# Patient Record
Sex: Female | Born: 1955 | Race: White | Hispanic: No | Marital: Married | Smoking: Never smoker
Health system: Southern US, Community
[De-identification: ages and names within clinical notes are randomized; demographics above are authoritative.]

## PROBLEM LIST (undated history)

## (undated) DIAGNOSIS — E039 Hypothyroidism, unspecified: Secondary | ICD-10-CM

## (undated) DIAGNOSIS — R232 Flushing: Secondary | ICD-10-CM

## (undated) DIAGNOSIS — I10 Essential (primary) hypertension: Secondary | ICD-10-CM

## (undated) HISTORY — PX: TUBAL LIGATION: SHX77

## (undated) HISTORY — PX: FOOT SURGERY: SHX648

## (undated) HISTORY — DX: Essential (primary) hypertension: I10

## (undated) HISTORY — DX: Hypothyroidism, unspecified: E03.9

## (undated) HISTORY — DX: Flushing: R23.2

---

## 1998-01-29 ENCOUNTER — Other Ambulatory Visit: Admission: RE | Admit: 1998-01-29 | Discharge: 1998-01-29 | Payer: Self-pay | Admitting: Obstetrics and Gynecology

## 1998-03-01 ENCOUNTER — Inpatient Hospital Stay (HOSPITAL_COMMUNITY): Admission: AD | Admit: 1998-03-01 | Discharge: 1998-03-03 | Payer: Self-pay | Admitting: *Deleted

## 1999-02-24 ENCOUNTER — Other Ambulatory Visit: Admission: RE | Admit: 1999-02-24 | Discharge: 1999-02-24 | Payer: Self-pay | Admitting: Obstetrics and Gynecology

## 2000-03-23 ENCOUNTER — Other Ambulatory Visit: Admission: RE | Admit: 2000-03-23 | Discharge: 2000-03-23 | Payer: Self-pay | Admitting: Obstetrics and Gynecology

## 2001-08-25 ENCOUNTER — Other Ambulatory Visit: Admission: RE | Admit: 2001-08-25 | Discharge: 2001-08-25 | Payer: Self-pay | Admitting: Obstetrics and Gynecology

## 2002-10-17 ENCOUNTER — Other Ambulatory Visit: Admission: RE | Admit: 2002-10-17 | Discharge: 2002-10-17 | Payer: Self-pay | Admitting: Obstetrics and Gynecology

## 2003-12-18 ENCOUNTER — Other Ambulatory Visit: Admission: RE | Admit: 2003-12-18 | Discharge: 2003-12-18 | Payer: Self-pay | Admitting: Obstetrics and Gynecology

## 2005-02-11 ENCOUNTER — Other Ambulatory Visit: Admission: RE | Admit: 2005-02-11 | Discharge: 2005-02-11 | Payer: Self-pay | Admitting: Obstetrics and Gynecology

## 2005-09-09 ENCOUNTER — Encounter: Payer: Self-pay | Admitting: Internal Medicine

## 2006-02-17 ENCOUNTER — Ambulatory Visit: Payer: Self-pay | Admitting: Internal Medicine

## 2006-02-24 ENCOUNTER — Ambulatory Visit: Payer: Self-pay | Admitting: Cardiology

## 2006-09-06 ENCOUNTER — Ambulatory Visit: Payer: Self-pay | Admitting: Internal Medicine

## 2006-09-06 DIAGNOSIS — R930 Abnormal findings on diagnostic imaging of skull and head, not elsewhere classified: Secondary | ICD-10-CM | POA: Insufficient documentation

## 2006-09-10 ENCOUNTER — Encounter (INDEPENDENT_AMBULATORY_CARE_PROVIDER_SITE_OTHER): Payer: Self-pay | Admitting: *Deleted

## 2006-10-11 ENCOUNTER — Telehealth (INDEPENDENT_AMBULATORY_CARE_PROVIDER_SITE_OTHER): Payer: Self-pay | Admitting: *Deleted

## 2006-10-11 ENCOUNTER — Ambulatory Visit: Payer: Self-pay | Admitting: Internal Medicine

## 2007-04-04 ENCOUNTER — Ambulatory Visit: Payer: Self-pay | Admitting: Internal Medicine

## 2008-02-28 ENCOUNTER — Encounter: Payer: Self-pay | Admitting: Internal Medicine

## 2008-02-29 ENCOUNTER — Ambulatory Visit: Payer: Self-pay | Admitting: Family Medicine

## 2008-02-29 DIAGNOSIS — I10 Essential (primary) hypertension: Secondary | ICD-10-CM

## 2008-03-02 ENCOUNTER — Telehealth (INDEPENDENT_AMBULATORY_CARE_PROVIDER_SITE_OTHER): Payer: Self-pay | Admitting: *Deleted

## 2008-03-02 LAB — CONVERTED CEMR LAB
BUN: 16 mg/dL (ref 6–23)
Basophils Absolute: 0 10*3/uL (ref 0.0–0.1)
Basophils Relative: 0.2 % (ref 0.0–3.0)
CO2: 31 meq/L (ref 19–32)
Calcium: 9.6 mg/dL (ref 8.4–10.5)
Chloride: 102 meq/L (ref 96–112)
Creatinine, Ser: 0.8 mg/dL (ref 0.4–1.2)
Eosinophils Absolute: 0.1 10*3/uL (ref 0.0–0.7)
Eosinophils Relative: 1.8 % (ref 0.0–5.0)
GFR calc Af Amer: 98 mL/min
GFR calc non Af Amer: 81 mL/min
Glucose, Bld: 88 mg/dL (ref 70–99)
HCT: 42.2 % (ref 36.0–46.0)
Hemoglobin: 14.5 g/dL (ref 12.0–15.0)
Lymphocytes Relative: 31.3 % (ref 12.0–46.0)
MCHC: 34.3 g/dL (ref 30.0–36.0)
MCV: 93.5 fL (ref 78.0–100.0)
Monocytes Absolute: 0.8 10*3/uL (ref 0.1–1.0)
Monocytes Relative: 13 % — ABNORMAL HIGH (ref 3.0–12.0)
Neutro Abs: 3.1 10*3/uL (ref 1.4–7.7)
Neutrophils Relative %: 53.7 % (ref 43.0–77.0)
Platelets: 172 10*3/uL (ref 150–400)
Potassium: 4.2 meq/L (ref 3.5–5.1)
RBC: 4.52 M/uL (ref 3.87–5.11)
RDW: 11.7 % (ref 11.5–14.6)
Sodium: 139 meq/L (ref 135–145)
TSH: 7.36 microintl units/mL — ABNORMAL HIGH (ref 0.35–5.50)
WBC: 5.8 10*3/uL (ref 4.5–10.5)

## 2008-03-21 ENCOUNTER — Ambulatory Visit: Payer: Self-pay | Admitting: Family Medicine

## 2008-03-21 DIAGNOSIS — E039 Hypothyroidism, unspecified: Secondary | ICD-10-CM

## 2008-03-22 ENCOUNTER — Encounter (INDEPENDENT_AMBULATORY_CARE_PROVIDER_SITE_OTHER): Payer: Self-pay | Admitting: *Deleted

## 2008-03-22 LAB — CONVERTED CEMR LAB
BUN: 21 mg/dL (ref 6–23)
CO2: 30 meq/L (ref 19–32)
Calcium: 9.7 mg/dL (ref 8.4–10.5)
Chloride: 102 meq/L (ref 96–112)
Creatinine, Ser: 0.8 mg/dL (ref 0.4–1.2)
GFR calc Af Amer: 98 mL/min
GFR calc non Af Amer: 81 mL/min
Glucose, Bld: 85 mg/dL (ref 70–99)
Potassium: 4 meq/L (ref 3.5–5.1)
Sodium: 139 meq/L (ref 135–145)

## 2008-04-04 ENCOUNTER — Ambulatory Visit: Payer: Self-pay | Admitting: Family Medicine

## 2008-04-25 ENCOUNTER — Telehealth: Payer: Self-pay | Admitting: Internal Medicine

## 2008-05-07 ENCOUNTER — Ambulatory Visit: Payer: Self-pay | Admitting: Family Medicine

## 2008-05-09 ENCOUNTER — Encounter (INDEPENDENT_AMBULATORY_CARE_PROVIDER_SITE_OTHER): Payer: Self-pay | Admitting: *Deleted

## 2008-05-09 LAB — CONVERTED CEMR LAB: TSH: 1.09 microintl units/mL (ref 0.35–5.50)

## 2008-09-04 ENCOUNTER — Ambulatory Visit: Payer: Self-pay | Admitting: Family Medicine

## 2009-04-04 ENCOUNTER — Encounter (INDEPENDENT_AMBULATORY_CARE_PROVIDER_SITE_OTHER): Payer: Self-pay | Admitting: *Deleted

## 2009-05-16 ENCOUNTER — Ambulatory Visit: Payer: Self-pay | Admitting: Internal Medicine

## 2009-05-16 DIAGNOSIS — N951 Menopausal and female climacteric states: Secondary | ICD-10-CM | POA: Insufficient documentation

## 2009-05-21 LAB — CONVERTED CEMR LAB
BUN: 21 mg/dL (ref 6–23)
Basophils Absolute: 0 10*3/uL (ref 0.0–0.1)
Basophils Relative: 0.4 % (ref 0.0–3.0)
CO2: 31 meq/L (ref 19–32)
Calcium: 10.1 mg/dL (ref 8.4–10.5)
Chloride: 103 meq/L (ref 96–112)
Creatinine, Ser: 0.8 mg/dL (ref 0.4–1.2)
Eosinophils Absolute: 0.1 10*3/uL (ref 0.0–0.7)
Eosinophils Relative: 2 % (ref 0.0–5.0)
GFR calc non Af Amer: 80.23 mL/min (ref >60)
Glucose, Bld: 83 mg/dL (ref 70–99)
HCT: 42.8 % (ref 36.0–46.0)
Hemoglobin: 14.7 g/dL (ref 12.0–15.0)
Lymphocytes Relative: 34.5 % (ref 12.0–46.0)
Lymphs Abs: 1.6 10*3/uL (ref 0.7–4.0)
MCHC: 34.3 g/dL (ref 30.0–36.0)
MCV: 94.3 fL (ref 78.0–100.0)
Monocytes Absolute: 0.5 10*3/uL (ref 0.1–1.0)
Monocytes Relative: 11.8 % (ref 3.0–12.0)
Neutro Abs: 2.4 10*3/uL (ref 1.4–7.7)
Neutrophils Relative %: 51.3 % (ref 43.0–77.0)
Platelets: 177 10*3/uL (ref 150.0–400.0)
Potassium: 4.7 meq/L (ref 3.5–5.1)
RBC: 4.53 M/uL (ref 3.87–5.11)
RDW: 12.7 % (ref 11.5–14.6)
Sodium: 141 meq/L (ref 135–145)
TSH: 1.59 microintl units/mL (ref 0.35–5.50)
WBC: 4.6 10*3/uL (ref 4.5–10.5)

## 2009-09-23 ENCOUNTER — Ambulatory Visit: Payer: Self-pay | Admitting: Family Medicine

## 2009-09-23 DIAGNOSIS — L259 Unspecified contact dermatitis, unspecified cause: Secondary | ICD-10-CM

## 2009-09-25 ENCOUNTER — Ambulatory Visit: Payer: Self-pay | Admitting: Family Medicine

## 2009-09-25 DIAGNOSIS — H938X9 Other specified disorders of ear, unspecified ear: Secondary | ICD-10-CM

## 2010-01-14 ENCOUNTER — Encounter: Admission: RE | Admit: 2010-01-14 | Discharge: 2010-01-14 | Payer: Self-pay | Admitting: Obstetrics and Gynecology

## 2010-03-12 ENCOUNTER — Ambulatory Visit
Admission: RE | Admit: 2010-03-12 | Discharge: 2010-03-12 | Payer: Self-pay | Source: Home / Self Care | Attending: Internal Medicine | Admitting: Internal Medicine

## 2010-03-12 DIAGNOSIS — M26629 Arthralgia of temporomandibular joint, unspecified side: Secondary | ICD-10-CM | POA: Insufficient documentation

## 2010-03-12 DIAGNOSIS — H9319 Tinnitus, unspecified ear: Secondary | ICD-10-CM | POA: Insufficient documentation

## 2010-03-18 NOTE — Assessment & Plan Note (Signed)
Summary: poison ivy no better/cbs   Vital Signs:  Patient profile:   55 year old female Weight:      149 pounds Pulse rate:   96 / minute BP sitting:   114 / 72  (left arm)  Vitals Entered By: Doristine Devoid CMA (September 25, 2009 9:02 AM) CC: poison ivy worst    History of Present Illness: 55 yo woman here today for worsening poison.  hands and fingers now itching, R forearm, both legs.  thinks she was re-exposed yesterday.    also complains of itching and drainage from R ear.  denies pain.  no recent swimming.  Current Medications (verified): 1)  Effexor Xr 37.5 Mg  Cp24 (Venlafaxine Hcl) .Marland Kitchen.. 1 By Mouth Once Daily 2)  Hydrochlorothiazide 12.5 Mg  Tabs (Hydrochlorothiazide) .... Take 1 Tab  By Mouth Every Morning - 3)  Synthroid 100 Mcg Tabs (Levothyroxine Sodium) .... Take One Tablet Daily 4)  Hydrocortisone 2.5 % Oint (Hydrocortisone) .... Apply To Affected Area 2-3x/day.  Disp 1 Large Tube  Allergies (verified): 1)  ! Codeine  Review of Systems      See HPI  Physical Exam  General:  alert, well-developed, and well-nourished.   Ears:  debris in R ear canal, no pain w/ manipulation of pinna.  + Dry skin.  TM WNL Skin:  vesicles on dorsum of L hand, linear grouping of vesicles on L forearm, R forearm.  linear grouping of vesicles behind R knee   Impression & Recommendations:  Problem # 1:  CONTACT DERMATITIS (ICD-692.9) Assessment Deteriorated now w/ additional areas, increased itching.  start prednisone after depo medrol injxn in office.  reviewed supportive care and red flags that should prompt return.  Pt expresses understanding and is in agreement w/ this plan. Her updated medication list for this problem includes:    Hydrocortisone 2.5 % Oint (Hydrocortisone) .Marland Kitchen... Apply to affected area 2-3x/day.  disp 1 large tube    Prednisone 20 Mg Tabs (Prednisone) .Marland Kitchen... 2 tabs daily x5 days  Problem # 2:  OTHER DISORDERS OF EAR (ICD-388.8) Assessment: New dry, flakey skin w/  waxy debris in ear.  start cortisporin ear drops for possible early external otitis vs dermatitis.  Complete Medication List: 1)  Effexor Xr 37.5 Mg Cp24 (Venlafaxine hcl) .Marland Kitchen.. 1 by mouth once daily 2)  Hydrochlorothiazide 12.5 Mg Tabs (Hydrochlorothiazide) .... Take 1 tab  by mouth every morning - 3)  Synthroid 100 Mcg Tabs (Levothyroxine sodium) .... Take one tablet daily 4)  Hydrocortisone 2.5 % Oint (Hydrocortisone) .... Apply to affected area 2-3x/day.  disp 1 large tube 5)  Prednisone 20 Mg Tabs (Prednisone) .... 2 tabs daily x5 days 6)  Neomycin-polymyxin-hc 3.5-10000-1 Susp (Neomycin-polymyxin-hc) .... 3 drops to affected ear three times a day for 5 days.  disp 1 large bottle  Patient Instructions: 1)  Start prednisone tomorrow 2)  Try and avoid contact w/ the plant oils 3)  Wash everything that you were wearing while outside 4)  Use the ear drops as needed 5)  Hang in there! Prescriptions: NEOMYCIN-POLYMYXIN-HC 3.5-10000-1 SUSP (NEOMYCIN-POLYMYXIN-HC) 3 drops to affected ear three times a day for 5 days.  disp 1 large bottle  #1 x 0   Entered and Authorized by:   Neena Rhymes MD   Signed by:   Neena Rhymes MD on 09/25/2009   Method used:   Electronically to        [facility] [phone]* (retail)  [address]       [place], [place]  [address]       Ph: [phone]       Fax: [phone]   RxID:   [id number] PREDNISONE 20 MG TABS (PREDNISONE) 2 tabs daily x5 days  #10 x 0   Entered and Authorized by:   Neena Rhymes MD   Signed by:   Neena Rhymes MD on 09/25/2009   Method used:   Electronically to        [facility] [phone]* (retail)       [address]       [place], [place]  [address]       Ph: [phone]       Fax: [phone]   RxID:   [id number]   Appended Document: poison ivy no better/cbs    Clinical Lists Changes  Orders: Added new Service order of Depo- Medrol 80mg  (J1040) - Signed Added new Service order of Admin of  Therapeutic Inj  intramuscular or subcutaneous (60630) - Signed         Medication Administration  Injection # 1:    Medication: Depo- Medrol 80mg     Diagnosis: CONTACT DERMATITIS (ICD-692.9)    Route: IM    Site: R deltoid    Exp Date: 06/17/2010    Lot #: [id number]    Mfr: Pharmacia    Given by: Doristine Devoid CMA (September 25, 2009 9:27 AM)  Orders Added: 1)  Depo- Medrol 80mg  [J1040] 2)  Admin of Therapeutic Inj  intramuscular or subcutaneous [93235]

## 2010-03-18 NOTE — Assessment & Plan Note (Signed)
Summary: POISON OAK/PAZ PT//KN   Vital Signs:  Patient profile:   55 year old female Weight:      148 pounds Pulse rate:   98 / minute BP sitting:   110 / 80  (left arm)  Vitals Entered By: Doristine Devoid CMA (September 23, 2009 10:43 AM) CC: poison oak x1 week    History of Present Illness: 55 yo woman here today for ? poison oak.  was exposed 5-6 days ago while doing yard work.  has an area on L hand, L forearm, neck, behind knees.  red, itchy.  has used OTC poison ivy wash and ETOH.  Current Medications (verified): 1)  Effexor Xr 37.5 Mg  Cp24 (Venlafaxine Hcl) .Marland Kitchen.. 1 By Mouth Once Daily 2)  Hydrochlorothiazide 12.5 Mg  Tabs (Hydrochlorothiazide) .... Take 1 Tab  By Mouth Every Morning - 3)  Synthroid 100 Mcg Tabs (Levothyroxine Sodium) .... Take One Tablet Daily 4)  Hydrocortisone 2.5 % Oint (Hydrocortisone) .... Apply To Affected Area 2-3x/day.  Disp 1 Large Tube  Allergies (verified): 1)  ! Codeine  Review of Systems      See HPI  Physical Exam  General:  alert, well-developed, and well-nourished.   Skin:  vesicle on dorsum of L hand, linear grouping of vesicles on L forearm.  linear grouping of vesicles behind R knee   Impression & Recommendations:  Problem # 1:  CONTACT DERMATITIS (ICD-692.9) Assessment New mild.  start prescription steroid cream.  reviewed supportive care and red flags that should prompt return.  Pt expresses understanding and is in agreement w/ this plan. Her updated medication list for this problem includes:    Hydrocortisone 2.5 % Oint (Hydrocortisone) .Marland Kitchen... Apply to affected area 2-3x/day.  disp 1 large tube  Complete Medication List: 1)  Effexor Xr 37.5 Mg Cp24 (Venlafaxine hcl) .Marland Kitchen.. 1 by mouth once daily 2)  Hydrochlorothiazide 12.5 Mg Tabs (Hydrochlorothiazide) .... Take 1 tab  by mouth every morning - 3)  Synthroid 100 Mcg Tabs (Levothyroxine sodium) .... Take one tablet daily 4)  Hydrocortisone 2.5 % Oint (Hydrocortisone) .... Apply to  affected area 2-3x/day.  disp 1 large tube  Patient Instructions: 1)  Use the hydrocortisone cream as needed for itching/inflammation 2)  Benadryl as needed for night itching and/or sleep 3)  Wash everything that came in contact w/ the plant oil to prevent spread 4)  Hang in there! Prescriptions: HYDROCORTISONE 2.5 % OINT (HYDROCORTISONE) apply to affected area 2-3x/day.  disp 1 large tube  #1 x 1   Entered and Authorized by:   Neena Rhymes MD   Signed by:   Neena Rhymes MD on 09/23/2009   Method used:   Electronically to        [facility] [phone]* (retail)       [address]       [place], [place]  [address]       Ph: [phone]       Fax: [phone]   RxID:   [id number]

## 2010-03-18 NOTE — Assessment & Plan Note (Signed)
Summary: FU/KDC   Vital Signs:  Patient profile:   55 year old female Height:      68 inches Weight:      151 pounds BMI:     23.04 Pulse rate:   76 / minute BP sitting:   122 / 80  Vitals Entered By: Shary Decamp (May 16, 2009 10:46 AM) CC: rov - not fasting   History of Present Illness: ROV   Current Medications (verified): 1)  Effexor Xr 37.5 Mg  Cp24 (Venlafaxine Hcl) .Marland Kitchen.. 1 By Mouth Once Daily 2)  Hydrochlorothiazide 12.5 Mg  Tabs (Hydrochlorothiazide) .... Take 1 Tab  By Mouth Every Morning - 3)  Synthroid 100 Mcg Tabs (Levothyroxine Sodium) .... Take One Tablet Daily  Allergies (verified): 1)  ! Codeine  Past History:  Past Medical History: HTN hypothyroid hot flashes-- on effexor   Past Surgical History: BTL  Family History: CAD-- prents colon ca--no breast ca-- M aunt   Social History: parents Mr. 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 Resp:  Denies cough.  Physical Exam  General:  alert, well-developed, and well-nourished.   Ears:  R ear normal and L ear normal.  slightly dry skin in the concha Lungs:  normal respiratory effort, no intercostal retractions, no accessory muscle use, and normal breath sounds.   Heart:  normal rate, regular rhythm, and no murmur.   Extremities:  no edema Psych:  Oriented X3, memory intact for recent and remote, normally interactive, good eye contact, not anxious appearing, and not depressed appearing.     Impression & Recommendations:  Problem # 1:  HYPOTHYROIDISM (ICD-244.9)  labs advised her to return to the office once a year, depending on the results we may ask her to come back for blow work only in 6 months Her updated medication list for this problem includes:    Synthroid  100 Mcg Tabs (Levothyroxine sodium) .Marland Kitchen... Take one tablet daily  Labs Reviewed: TSH: 1.09 (05/07/2008)     Orders: TLB-TSH (Thyroid Stimulating Hormone) (84443-TSH)  Problem # 2:  HYPERTENSION, BENIGN ESSENTIAL (ICD-401.1)  seems to be well-controlled, labs Her updated medication list for this problem includes:    Hydrochlorothiazide 12.5 Mg Tabs (Hydrochlorothiazide) .Marland Kitchen... Take 1 tab  by mouth every morning -  BP today: 122/80 Prior BP: 140/90 (09/04/2008)  Labs Reviewed: K+: 4.0 (03/21/2008) Creat: : 0.8 (03/21/2008)     Orders: Venipuncture ([address]) TLB-BMP (Basic Metabolic Panel-BMET) (80048-METABOL) TLB-CBC Platelet - w/Differential (85025-CBCD)  Problem # 3:  HOT FLASHES (ICD-627.2) of Effexor per gynecology  Problem # 4:  ROUTINE GENERAL MEDICAL EXAM@HEALTH  CARE FACL (ICD-V70.0) sees gynecology routinely states that she had colonoscopies before would like her cholesterol checked today, she is not fasting, we agreed that she will ask her gynecologist check a FLP  and they could send me the results  Complete Medication List: 1)  Effexor Xr 37.5 Mg Cp24 (Venlafaxine hcl) .Marland Kitchen.. 1 by mouth once daily 2)  Hydrochlorothiazide 12.5 Mg Tabs (Hydrochlorothiazide) .... Take 1 tab  by mouth every morning - 3)  Synthroid 100 Mcg Tabs (Levothyroxine sodium) .... Take one tablet daily  Patient Instructions: 1)  Check your blood pressure 2 or 3 times a week. If it is  more than 140/85 consistently,please let us know  2)  Please schedule a follow-up appointment in 1 year.

## 2010-03-18 NOTE — Letter (Signed)
Summary: Primary Care Appointment Letter  [facility] [place]  [address] [place], [place] [address]   Phone: [phone]  Fax: [phone]    04/04/2009 MRN: [id number]  [facility] [address] [place], [place]  [address]  Dear Ms. Durene Cal,   Your Primary Care Physician Royalton E. Paz MD has indicated that:    __X_____it is time to schedule an appointment.  Please call our office @ [phone] to schedule an office visit.      Thank you,    [facility] Primary [facility] Scheduler

## 2010-03-20 NOTE — Assessment & Plan Note (Signed)
Summary: ear problem/kn   Vital Signs:  Patient profile:   55 year old female Height:      68 inches Weight:      152 pounds BMI:     23.20 Temp:     98.1 degrees F oral Pulse rate:   78 / minute Pulse rhythm:   regular BP sitting:   122 / 78  (left arm) Cuff size:   regular  Vitals Entered By: Army Fossa CMA (March 12, 2010 3:53 PM) CC: Pt here 2 problems Comments sounds like static in ear jaw joint will not stay in place [facility] [address]    History of Present Illness: 6 months  history of right-sided ringing in the ear described as "static", symptoms are on an off, worse at night.  also of several months history of problems with the right TMJ, it feels like the joint gets out of place when she bites. Also symptoms increase  at night when she sleeps on the right side.  ROS Some headaches No ear pain no taking any new medicines no history of jaw trauma She reports that her bite  may not be even.   Current Medications (verified): 1)  Effexor Xr 37.5 Mg  Cp24 (Venlafaxine Hcl) .Marland Kitchen.. 1 By Mouth Once Daily 2)  Hydrochlorothiazide 12.5 Mg  Tabs (Hydrochlorothiazide) .... Take 1 Tab  By Mouth Every Morning - 3)  Synthroid 100 Mcg Tabs (Levothyroxine Sodium) .... Take One Tablet Daily  Allergies (verified): 1)  ! Codeine  Past History:  Past Medical History: Reviewed history from 05/16/2009 and no changes required. HTN hypothyroid hot flashes-- on effexor   Past Surgical History: Reviewed history from 05/16/2009 and no changes required. BTL  Social History: Reviewed history from 05/16/2009 and no changes required. parents Mr. 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   Head:  face symmetric. Right TMJ with a click  and in fact it feels like her jaw is out of place with opening of the mouth Ears:  R ear normal and L ear normal.   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 Refer to ENT  Orders: ENT Referral (ENT)  Problem # 2:  TMJ PAIN (ICD-524.62)  several months history of pain and what seems to be TMJ dislocation She reports that her bite  may not be even. recommend ENT eval  she also may need to be seen by  her dentist as well.  Orders: ENT Referral (ENT)  Complete Medication List: 1)  Effexor Xr 37.5 Mg Cp24 (Venlafaxine hcl) .Marland Kitchen.. 1 by mouth once daily 2)  Hydrochlorothiazide 12.5 Mg Tabs (Hydrochlorothiazide) .... Take 1 tab  by mouth every morning - 3)  Synthroid 100 Mcg Tabs (Levothyroxine sodium) .... Take one tablet daily Prescriptions: SYNTHROID 100 MCG TABS (LEVOTHYROXINE SODIUM) take one tablet daily  #90 x 1   Entered and Authorized by:   Elita Quick E. Morgon Pamer MD   Signed by:   Nolon Rod. Dorrie Cocuzza MD on 03/12/2010   Method used:   Electronically to        [facility] [phone]* (retail)       [address]  [place], [place]  [address]       Ph: [phone]       Fax: [phone]   RxID:   [id number] HYDROCHLOROTHIAZIDE 12.5 MG  TABS (HYDROCHLOROTHIAZIDE) Take 1 tab  by mouth every morning -  #90 x 1   Entered and Authorized by:   Nolon Rod. Ceferino Lang MD   Signed by:   Nolon Rod. Elmyra Banwart MD on 03/12/2010   Method used:   Electronically to        [facility] [phone]* (retail)       [address]       [place], [place]  [address]       Ph: [phone]       Fax: [phone]   RxID:   [id number]    Orders Added: 1)  ENT Referral [ENT] 2)  Est. Patient Level III [[address]]

## 2010-03-31 ENCOUNTER — Encounter: Payer: Self-pay | Admitting: Internal Medicine

## 2010-04-08 ENCOUNTER — Encounter (INDEPENDENT_AMBULATORY_CARE_PROVIDER_SITE_OTHER): Payer: 59 | Admitting: Internal Medicine

## 2010-04-08 ENCOUNTER — Encounter: Payer: Self-pay | Admitting: Internal Medicine

## 2010-04-08 DIAGNOSIS — Z0000 Encounter for general adult medical examination without abnormal findings: Secondary | ICD-10-CM

## 2010-04-14 ENCOUNTER — Encounter: Payer: Self-pay | Admitting: Internal Medicine

## 2010-04-14 ENCOUNTER — Encounter (INDEPENDENT_AMBULATORY_CARE_PROVIDER_SITE_OTHER): Payer: Self-pay | Admitting: *Deleted

## 2010-04-14 ENCOUNTER — Other Ambulatory Visit (INDEPENDENT_AMBULATORY_CARE_PROVIDER_SITE_OTHER): Payer: 59

## 2010-04-14 ENCOUNTER — Other Ambulatory Visit: Payer: Self-pay | Admitting: Internal Medicine

## 2010-04-14 DIAGNOSIS — Z0000 Encounter for general adult medical examination without abnormal findings: Secondary | ICD-10-CM

## 2010-04-14 DIAGNOSIS — E785 Hyperlipidemia, unspecified: Secondary | ICD-10-CM

## 2010-04-14 LAB — CBC WITH DIFFERENTIAL/PLATELET
Basophils Relative: 0.6 % (ref 0.0–3.0)
Eosinophils Relative: 1.4 % (ref 0.0–5.0)
HCT: 41 % (ref 36.0–46.0)
Hemoglobin: 14.1 g/dL (ref 12.0–15.0)
Lymphs Abs: 2 10*3/uL (ref 0.7–4.0)
MCV: 93.5 fl (ref 78.0–100.0)
Monocytes Absolute: 0.7 10*3/uL (ref 0.1–1.0)
Monocytes Relative: 12.8 % — ABNORMAL HIGH (ref 3.0–12.0)
Neutro Abs: 2.3 10*3/uL (ref 1.4–7.7)
Platelets: 176 10*3/uL (ref 150.0–400.0)
RBC: 4.38 Mil/uL (ref 3.87–5.11)
WBC: 5.1 10*3/uL (ref 4.5–10.5)

## 2010-04-14 LAB — BASIC METABOLIC PANEL
BUN: 14 mg/dL (ref 6–23)
Chloride: 106 mEq/L (ref 96–112)
GFR: 86.13 mL/min (ref >60.00)
Potassium: 4.3 mEq/L (ref 3.5–5.1)
Sodium: 143 mEq/L (ref 135–145)

## 2010-04-14 LAB — LIPID PANEL
Cholesterol: 217 mg/dL — ABNORMAL HIGH (ref 0–200)
HDL: 70.8 mg/dL (ref >39.00)
Total CHOL/HDL Ratio: 3
Triglycerides: 60 mg/dL (ref 0.0–149.0)
VLDL: 12 mg/dL (ref 0.0–40.0)

## 2010-04-14 LAB — LDL CHOLESTEROL, DIRECT: Direct LDL: 132.9 mg/dL

## 2010-04-14 LAB — TSH: TSH: 2.1 u[IU]/mL (ref 0.35–5.50)

## 2010-04-14 LAB — CONVERTED CEMR LAB: Vit D, 25-Hydroxy: 20 ng/mL — ABNORMAL LOW (ref 30–89)

## 2010-04-15 NOTE — Assessment & Plan Note (Signed)
Summary: physical, will come back for fasting labs///sph   Vital Signs:  Patient profile:   55 year old female Weight:      153 pounds Pulse rate:   70 / minute Pulse rhythm:   regular BP sitting:   126 / 76  (left arm) Cuff size:   regular  Vitals Entered By: Army Fossa CMA (April 08, 2010 3:23 PM) CC: CPX, not fasting  Comments no complaints  [facility] [address]  due for pap and mammo- has a gyn  unsure of strenght of effexor   History of Present Illness: CPX   Preventive Screening-Counseling & Management  Alcohol-Tobacco     Alcohol drinks/day: 1     Alcohol type: wine  Caffeine-Diet-Exercise     Does Patient Exercise: yes  Current Medications (verified): 1)  Effexor Xr 37.5 Mg  Cp24 (Venlafaxine Hcl) .Marland Kitchen.. 1 By Mouth Once Daily 2)  Hydrochlorothiazide 12.5 Mg  Tabs (Hydrochlorothiazide) .... Take 1 Tab  By Mouth Every Morning - 3)  Synthroid 100 Mcg Tabs (Levothyroxine Sodium) .... Take One Tablet Daily  Allergies (verified): 1)  ! Codeine  Past History:  Past Medical History: Reviewed history from 05/16/2009 and no changes required. HTN hypothyroid hot flashes-- on effexor   Past Surgical History: Reviewed history from 05/16/2009 and no changes required. BTL  Family History: CAD-- parents;  F dx age late 56s  DM--no strokes --  colon ca--no breast ca-- MGM, aunt   Social History: parents Mr. 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 CV:  Denies chest pain or discomfort and swelling of feet. Resp:  Denies cough and shortness of breath. GI:  Denies bloody stools, diarrhea, nausea, and vomiting. Psych:  Denies anxiety and depression.  Physical Exam  General:  alert, well-developed, and well-nourished.   Neck:  no masses, no  thyromegaly, and normal carotid upstroke.   Lungs:  normal respiratory effort, no intercostal retractions, no accessory muscle use, and normal breath sounds.   Heart:  normal rate, regular rhythm, and no murmur.   Abdomen:  soft, non-tender, no distention, no masses, no guarding, and no rigidity.   Extremities:  no pretibial edema bilaterally  Psych:  Cognition and judgment appear intact. Alert and cooperative with normal attention span and concentration.  not anxious appearing and not depressed appearing.     Impression & Recommendations:  Problem # 1:  ROUTINE GENERAL MEDICAL EXAM@HEALTH  CARE FACL (ICD-V70.0)  Td 06 had flu shot  sees gynecology routinely [facility])  states that she had cscopes x 3, h/o GI bleed, last Cscope  ~2010, will need a new GI at some point (referal next year)  +FH of CAD, EKG today...NSR, poor Rwave progression? she is asx, repeat EKGs yearly   LDL goal <130  diet-exercise  discussed   Orders: EKG w/ Interpretation ([address])  Problem # 2:  other issues --has a cyst in the 2 R toe, saw podiatrist who aspirate somefluid, likes to see somebody else. She willl call Dr Jerl Santos for an appointment, he sees her mom --ENT dental eval pending (see previous notes )  Complete Medication List: 1)  Effexor Xr 37.5 Mg Cp24 (Venlafaxine hcl) .Marland Kitchen.. 1 by mouth once daily 2)  Hydrochlorothiazide 12.5 Mg Tabs (Hydrochlorothiazide) .... Take 1 tab  by mouth every morning - 3)  Synthroid 100 Mcg Tabs (Levothyroxine sodium) .... Take one tablet daily  Patient Instructions: 1)  came back fasting: 2)  FLP BMP CBC TSH AST ALT Vit D---dx v70 3)  Please schedule a follow-up appointment in 1 year.    Orders Added: 1)  EKG w/ Interpretation [93[address]] 2)  Est. Patient age 32-64 [[address]]   Immunization History:  Tetanus/Td Immunization History:    Tetanus/Td:  historical (02/17/2004)  Influenza Immunization History:    Influenza:  historical  (10/17/2009)   Immunization History:  Tetanus/Td Immunization History:    Tetanus/Td:  Historical (02/17/2004)  Influenza Immunization History:    Influenza:  Historical (10/17/2009)    Preventive Care Screening  Colonoscopy:    Date:  02/17/2008    Results:  normal-per pt   Last Tetanus Booster:    Date:  02/17/2004    Results:  Historical     Risk Factors:  Alcohol use:  yes    Type:  wine    Drinks per day:  1 Exercise:  yes  Colonoscopy History:     Date of Last Colonoscopy:  02/17/2008    Results:  normal-per pt

## 2010-06-24 ENCOUNTER — Ambulatory Visit (INDEPENDENT_AMBULATORY_CARE_PROVIDER_SITE_OTHER): Payer: 59 | Admitting: Internal Medicine

## 2010-06-24 ENCOUNTER — Encounter: Payer: Self-pay | Admitting: Internal Medicine

## 2010-06-24 DIAGNOSIS — J069 Acute upper respiratory infection, unspecified: Secondary | ICD-10-CM

## 2010-06-24 MED ORDER — HYDROCODONE-HOMATROPINE 5-1.5 MG/5ML PO SYRP: 5.0000 mL | ORAL_SOLUTION | Freq: Four times a day (QID) | ORAL | Status: AC | PRN

## 2010-06-24 MED ORDER — AZITHROMYCIN 250 MG PO TABS: ORAL_TABLET | ORAL | Status: AC

## 2010-06-24 NOTE — Patient Instructions (Signed)
Rest, fluids , tylenol For cough, take Mucinex DM twice a day as needed  If the cough is severe , use hydrocodone (will cause drowsiness ) OTC claritin 10mg  1 a day Saline nasal spray as needed Take the antibiotic as prescribed (zothromax) if no better in 3-4 days Call if no better in few days Call anytime if the symptoms are severe, you have high fever, short of breath

## 2010-06-24 NOTE — Progress Notes (Signed)
  Subjective:    Patient ID: Zaylie Gisler, female    DOB: 07/23/1955, 55 y.o.   MRN: [id number]  HPI Symptoms started about 6 days ago with sore throat some postnasal dripping. 4 days ago she developed an intense cough at night, can't sleep well, due to the cough she vomited once. Past Medical History  Diagnosis Date  . Hypertension   . Hypothyroid   . Hot flashes     on effexor   Past Surgical History  Procedure Date  . Btl       Review of Systems Denies fever No chest congestion, no sputum production. Mild sinus congestion. No diarrhea. She has a severe runny nose but denies itchy eyes or itchy nose    Objective:   Physical Exam Alert, oriented x3. Face is symmetric. Not tender to palpation. ears: Tympanic membranes normal. Nose is congested. Oropharynx, no redness or discharge. Lungs, clear to auscultation bilaterally. Cardiovascular regular rate and rhythm without a murmur.       Assessment & Plan:

## 2010-06-24 NOTE — Assessment & Plan Note (Signed)
Is hard to distinguish between allergies or infection. She also has acquired chickens about a month ago and she may be allergic to them. Will treat as an allergy for few days but if she is no better, she will start taking an antibiotic. See instructions.

## 2010-07-16 ENCOUNTER — Other Ambulatory Visit: Payer: Self-pay | Admitting: Internal Medicine

## 2010-09-05 ENCOUNTER — Other Ambulatory Visit: Payer: Self-pay | Admitting: Internal Medicine

## 2010-10-16 ENCOUNTER — Other Ambulatory Visit: Payer: Self-pay | Admitting: Internal Medicine

## 2011-01-01 ENCOUNTER — Encounter: Payer: Self-pay | Admitting: Internal Medicine

## 2011-01-01 ENCOUNTER — Ambulatory Visit (INDEPENDENT_AMBULATORY_CARE_PROVIDER_SITE_OTHER): Payer: 59 | Admitting: Internal Medicine

## 2011-01-01 VITALS — BP 152/100 | HR 73 | Temp 98.2°F | Ht 67.0 in | Wt 161.0 lb

## 2011-01-01 DIAGNOSIS — I10 Essential (primary) hypertension: Secondary | ICD-10-CM

## 2011-01-01 DIAGNOSIS — J069 Acute upper respiratory infection, unspecified: Secondary | ICD-10-CM

## 2011-01-01 MED ORDER — AMOXICILLIN 500 MG PO CAPS: 1000.0000 mg | ORAL_CAPSULE | Freq: Two times a day (BID) | ORAL | Status: AC

## 2011-01-01 NOTE — Patient Instructions (Signed)
Rest, fluids , tylenol For cough, take Mucinex DM twice a day as needed  No decongestants Take the antibiotic as prescribed, Amoxicillin, if no better in few days  Call if no better in few days Call anytime if the symptoms are severe Check the  blood pressure 3 or 4 times a week, be sure it is less than 140/85. If it is consistently higher, let me know

## 2011-01-01 NOTE — Assessment & Plan Note (Signed)
BP elevated few days ago at another doctor's office and initially during this visit. I rechecked it myself and it was 140/75. I recommend to continue with her healthy lifestyle, try to eat less salt and keep an eye on her blood pressure. If the blood pressure is elevated I like to make changes before her next appointment in February 2013.

## 2011-01-01 NOTE — Progress Notes (Signed)
  Subjective:    Patient ID: Erin Long, female    DOB: [birth year], 55 y.o.   MRN: [id number]  HPI 4-5 days history of sore throat, mild cough. Ready to leave town, would like to get something before she leaves. Also her BP was noted to be slightly elevated at another doctor's office few days ago,  BP today was 150/100.  Past Medical History  Diagnosis Date  . Hypertension   . Hypothyroid   . Hot flashes     on effexor   Past Surgical History  Procedure Date  . Btl       Review of Systems No fever or chills Mild sinus congestion, no sputum production. No chest congestion. As far as diet -exercise, she is actually doing better in the last few months. Admits that she is not doing a low-salt diet    Objective:   Physical Exam  Constitutional: She appears well-developed and well-nourished. No distress.  HENT:  Head: Normocephalic and atraumatic.  Right Ear: External ear normal.  Left Ear: External ear normal.  Mouth/Throat: No oropharyngeal exudate.       Face symmetric, not tender to palpation  Cardiovascular: Normal rate, regular rhythm and normal heart sounds.   No murmur heard.      BP rechecked by me 140/75  Pulmonary/Chest: Effort normal and breath sounds normal. No respiratory distress. She has no wheezes. She has no rales.  Skin: She is not diaphoretic.          Assessment & Plan:  URI: See instructions

## 2011-01-20 ENCOUNTER — Other Ambulatory Visit: Payer: Self-pay | Admitting: Internal Medicine

## 2011-01-21 ENCOUNTER — Other Ambulatory Visit: Payer: Self-pay | Admitting: Internal Medicine

## 2011-04-18 ENCOUNTER — Other Ambulatory Visit: Payer: Self-pay | Admitting: Internal Medicine

## 2011-04-20 NOTE — Telephone Encounter (Signed)
Prescription sent to the pharmacy.

## 2011-07-27 ENCOUNTER — Other Ambulatory Visit: Payer: Self-pay | Admitting: Internal Medicine

## 2011-07-27 NOTE — Telephone Encounter (Signed)
Refill done.  

## 2011-12-03 ENCOUNTER — Other Ambulatory Visit: Payer: Self-pay | Admitting: Internal Medicine

## 2011-12-03 NOTE — Telephone Encounter (Signed)
Refill request sent in error.

## 2011-12-29 ENCOUNTER — Ambulatory Visit: Payer: 59 | Admitting: Internal Medicine

## 2011-12-30 ENCOUNTER — Ambulatory Visit: Payer: 59 | Admitting: Internal Medicine

## 2012-01-06 ENCOUNTER — Other Ambulatory Visit: Payer: Self-pay | Admitting: Internal Medicine

## 2012-01-06 NOTE — Telephone Encounter (Signed)
Refill done. Pt must schedule OV for future refills.  

## 2012-01-25 ENCOUNTER — Telehealth: Payer: Self-pay | Admitting: Internal Medicine

## 2012-02-01 ENCOUNTER — Ambulatory Visit (INDEPENDENT_AMBULATORY_CARE_PROVIDER_SITE_OTHER): Payer: 59 | Admitting: Internal Medicine

## 2012-02-01 ENCOUNTER — Encounter: Payer: Self-pay | Admitting: Internal Medicine

## 2012-02-01 VITALS — BP 138/84 | HR 87 | Temp 98.0°F | Wt 160.0 lb

## 2012-02-01 DIAGNOSIS — K219 Gastro-esophageal reflux disease without esophagitis: Secondary | ICD-10-CM | POA: Insufficient documentation

## 2012-02-01 DIAGNOSIS — Z23 Encounter for immunization: Secondary | ICD-10-CM

## 2012-02-01 DIAGNOSIS — E039 Hypothyroidism, unspecified: Secondary | ICD-10-CM

## 2012-02-01 DIAGNOSIS — E559 Vitamin D deficiency, unspecified: Secondary | ICD-10-CM

## 2012-02-01 DIAGNOSIS — I10 Essential (primary) hypertension: Secondary | ICD-10-CM

## 2012-02-01 DIAGNOSIS — Z0000 Encounter for general adult medical examination without abnormal findings: Secondary | ICD-10-CM | POA: Insufficient documentation

## 2012-02-01 LAB — TSH: TSH: 1.39 u[IU]/mL (ref 0.35–5.50)

## 2012-02-01 LAB — BASIC METABOLIC PANEL
BUN: 16 mg/dL (ref 6–23)
Chloride: 101 mEq/L (ref 96–112)
Glucose, Bld: 97 mg/dL (ref 70–99)
Potassium: 3.9 mEq/L (ref 3.5–5.1)

## 2012-02-01 LAB — CBC WITH DIFFERENTIAL/PLATELET
Basophils Absolute: 0 10*3/uL (ref 0.0–0.1)
Eosinophils Absolute: 0.1 10*3/uL (ref 0.0–0.7)
HCT: 42.5 % (ref 36.0–46.0)
Lymphs Abs: 1.9 10*3/uL (ref 0.7–4.0)
MCV: 91.5 fl (ref 78.0–100.0)
Monocytes Absolute: 0.7 10*3/uL (ref 0.1–1.0)
Platelets: 182 10*3/uL (ref 150.0–400.0)
RDW: 12.5 % (ref 11.5–14.6)

## 2012-02-01 MED ORDER — HYDROCHLOROTHIAZIDE 12.5 MG PO CAPS: 12.5000 mg | ORAL_CAPSULE | Freq: Every day | ORAL | Status: DC

## 2012-02-01 MED ORDER — OMEPRAZOLE 40 MG PO CPDR: 40.0000 mg | DELAYED_RELEASE_CAPSULE | Freq: Every day | ORAL | Status: DC

## 2012-02-01 MED ORDER — LEVOTHYROXINE SODIUM 100 MCG PO TABS: 100.0000 ug | ORAL_TABLET | Freq: Every day | ORAL | Status: DC

## 2012-02-01 MED ORDER — VENLAFAXINE HCL ER 37.5 MG PO CP24: 37.5000 mg | ORAL_CAPSULE | Freq: Every day | ORAL | Status: DC

## 2012-02-01 NOTE — Assessment & Plan Note (Signed)
Symptoms consistent with GERD without red flag symptoms. Plan: Precautions discussed, see instructions. Omeprazole 40 mg before breakfast. Reassess on return to the office

## 2012-02-01 NOTE — Assessment & Plan Note (Signed)
Recommend  Daily vit d, recheck on return to the office

## 2012-02-01 NOTE — Assessment & Plan Note (Signed)
Good compliance, check a TSH

## 2012-02-01 NOTE — Progress Notes (Signed)
  Subjective:    Patient ID: Erin Long, female    DOB: [birth year], 56 y.o.   MRN: [id number]  HPI Office Hypertension, good medication compliance, no ambulatory BP Hypothyroidism, good medication compliance, due for labs. History of hot flashes, well-controlled on a low dose of Effexor GERD, has occasional on and off GERD symptoms described as classic heartburn, omeprazole OTC help to some extent but would like a prescribe medication.  Past Medical History  Diagnosis Date  . Hypertension   . Hypothyroid   . Hot flashes     on effexor   Past Surgical History  Procedure Date  . Btl    History   Social History  . Marital Status: Married    Spouse Name: N/A    Number of Children: 1  . Years of Education: N/A   Occupational History  . stay at home mom    Social History Main Topics  . Smoking status: Never Smoker   . Smokeless tobacco: Never Used  . Alcohol Use: Yes     Comment: socially  . Drug Use: No  . Sexually Active: Not on file   Other Topics Concern  . Not on file   Social History Narrative   One child one amd 3 Gchildren----Parents Mr. And Mrs.Buchanana(they are my pts)Very active, stationary bike sometimes      Review of Systems Denies fever chills or weight loss. No abdominal pain, blood in the stools or change in the color of the stools. No taking  any Motrin or Motrin-like medications. History of vitamin D deficiency, not taking vitamin D    Objective:   Physical Exam General -- alert, well-developed, and well-nourished.   Neck --no thyromegaly Lungs -- normal respiratory effort, no intercostal retractions, no accessory muscle use, and normal breath sounds.   Heart-- normal rate, regular rhythm, no murmur, and no gallop.   Abdomen--soft, non-tender, no distention, no masses, no HSM, no guarding, and no rigidity.   Extremities-- no pretibial edema bilaterally Psych-- Cognition and judgment appear intact. Alert and cooperative with normal  attention span and concentration.  not anxious appearing and not depressed appearing.      Assessment & Plan:

## 2012-02-01 NOTE — Assessment & Plan Note (Signed)
sees gynecology, recommend a CPX here at her convenience for non-female issues.

## 2012-02-01 NOTE — Patient Instructions (Addendum)
Take over-the-counter vitamin D between 600 and 1200 units daily. Please schedule a complete physical in around 4 months, fasting   Diet for Gastroesophageal Reflux Disease, Adult Reflux (acid reflux) is when acid from your stomach flows up into the esophagus. When acid comes in contact with the esophagus, the acid causes irritation and soreness (inflammation) in the esophagus. When reflux happens often or so severely that it causes damage to the esophagus, it is called gastroesophageal reflux disease (GERD). Nutrition therapy can help ease the discomfort of GERD. FOODS OR DRINKS TO AVOID OR LIMIT  Smoking or chewing tobacco. Nicotine is one of the most potent stimulants to acid production in the gastrointestinal tract.  Caffeinated and decaffeinated coffee and black tea.  Regular or low-calorie carbonated beverages or energy drinks (caffeine-free carbonated beverages are allowed).   Strong spices, such as black pepper, white pepper, red pepper, cayenne, curry powder, and chili powder.  Peppermint or spearmint.  Chocolate.  High-fat foods, including meats and fried foods. Extra added fats including oils, butter, salad dressings, and nuts. Limit these to less than 8 tsp per day.  Fruits and vegetables if they are not tolerated, such as citrus fruits or tomatoes.  Alcohol.  Any food that seems to aggravate your condition. If you have questions regarding your diet, call your caregiver or a registered dietitian. OTHER THINGS THAT MAY HELP GERD INCLUDE:   Eating your meals slowly, in a relaxed setting.  Eating 5 to 6 small meals per day instead of 3 large meals.  Eliminating food for a period of time if it causes distress.  Not lying down until 3 hours after eating a meal.  Keeping the head of your bed raised 6 to 9 inches (15 to 23 cm) by using a foam wedge or blocks under the legs of the bed. Lying flat may make symptoms worse.  Being physically active. Weight loss may be helpful  in reducing reflux in overweight or obese adults.  Wear loose fitting clothing EXAMPLE MEAL PLAN This meal plan is approximately 2,000 calories based on https://www.bernard.org/ meal planning guidelines. Breakfast   cup cooked oatmeal.  1 cup strawberries.  1 cup low-fat milk.  1 oz almonds. Snack  1 cup cucumber slices.  6 oz yogurt (made from low-fat or fat-free milk). Lunch  2 slice whole-wheat bread.  2 oz sliced [place].  2 tsp mayonnaise.  1 cup blueberries.  1 cup snap peas. Snack  6 whole-wheat crackers.  1 oz string cheese. Dinner   cup brown rice.  1 cup mixed veggies.  1 tsp olive oil.  3 oz grilled fish. Document Released: 02/02/2005 Document Revised: 04/27/2011 Document Reviewed: 12/19/2010 [facility] Patient Information 2013 [place], [place].

## 2012-02-01 NOTE — Assessment & Plan Note (Signed)
Seems well controlled, continue with the HCTZ, check a BMP and a CBC

## 2012-02-04 ENCOUNTER — Encounter: Payer: Self-pay | Admitting: *Deleted

## 2012-02-18 ENCOUNTER — Other Ambulatory Visit: Payer: Self-pay | Admitting: Internal Medicine

## 2012-02-18 NOTE — Telephone Encounter (Signed)
Spoke to rachel at pharmacy & she confirmed pt still has refills left on file. Refill request sent in error.

## 2012-09-21 ENCOUNTER — Other Ambulatory Visit: Payer: Self-pay | Admitting: Internal Medicine

## 2012-09-21 NOTE — Telephone Encounter (Signed)
Refill done per protocol.  

## 2012-11-25 ENCOUNTER — Ambulatory Visit (INDEPENDENT_AMBULATORY_CARE_PROVIDER_SITE_OTHER): Payer: 59

## 2012-11-25 DIAGNOSIS — Z23 Encounter for immunization: Secondary | ICD-10-CM

## 2013-02-27 ENCOUNTER — Other Ambulatory Visit: Payer: Self-pay | Admitting: Internal Medicine

## 2013-03-02 LAB — HM MAMMOGRAPHY

## 2013-03-08 ENCOUNTER — Other Ambulatory Visit: Payer: Self-pay | Admitting: Obstetrics and Gynecology

## 2013-03-08 DIAGNOSIS — Z803 Family history of malignant neoplasm of breast: Secondary | ICD-10-CM

## 2013-03-13 ENCOUNTER — Ambulatory Visit: Payer: 59 | Admitting: Cardiology

## 2013-03-23 ENCOUNTER — Encounter (INDEPENDENT_AMBULATORY_CARE_PROVIDER_SITE_OTHER): Payer: Self-pay

## 2013-03-23 ENCOUNTER — Encounter: Payer: Self-pay | Admitting: Cardiology

## 2013-03-23 ENCOUNTER — Ambulatory Visit (INDEPENDENT_AMBULATORY_CARE_PROVIDER_SITE_OTHER): Payer: 59 | Admitting: Cardiology

## 2013-03-23 VITALS — BP 130/80 | HR 68 | Ht 67.0 in | Wt 157.0 lb

## 2013-03-23 DIAGNOSIS — I10 Essential (primary) hypertension: Secondary | ICD-10-CM

## 2013-03-23 DIAGNOSIS — R079 Chest pain, unspecified: Secondary | ICD-10-CM | POA: Insufficient documentation

## 2013-03-23 NOTE — Progress Notes (Signed)
     HPI: 58 year old female for evaluation of chest pain. No prior cardiac history. She exercises routinely with no chest pain. She denies dyspnea on exertion, orthopnea, PND, pedal edema, palpitations, syncope. She has risk factors including strong family history. Approximately 2 weeks ago while watching TV she had chest tightness for approximately 10-15 minutes. The pain was not pleuritic or positional. No radiation. Resolved spontaneously. No associated symptoms.  Current Outpatient Prescriptions  Medication Sig Dispense Refill  . hydrochlorothiazide (MICROZIDE) 12.5 MG capsule TAKE 1 CAPSULE BY MOUTH DAILY  30 capsule  0  . levothyroxine (SYNTHROID, LEVOTHROID) 100 MCG tablet TAKE 1 TABLET BY MOUTH DAILY  30 tablet  0  . omeprazole (PRILOSEC) 40 MG capsule TAKE 1 CAPSULE BY MOUTH DAILY  30 capsule  5  . venlafaxine XR (EFFEXOR-XR) 37.5 MG 24 hr capsule TAKE 1 CAPSULE BY MOUTH DAILY  30 capsule  0   No current facility-administered medications for this visit.    Allergies  Allergen Reactions  . Codeine     REACTION: rash    Past Medical History  Diagnosis Date  . Hypertension   . Hypothyroid   . Hot flashes     on effexor    Past Surgical History  Procedure Laterality Date  . Tubal ligation Bilateral   . Foot surgery      twice    History   Social History  . Marital Status: Married    Spouse Name: N/A    Number of Children: 1  . Years of Education: N/A   Occupational History  . stay at home mom    Social History Main Topics  . Smoking status: Never Smoker   . Smokeless tobacco: Never Used  . Alcohol Use: Yes     Comment: socially  . Drug Use: No  . Sexual Activity: Not on file   Other Topics Concern  . Not on file   Social History Narrative   One child one amd 3 Gchildren----   Parents Mr. And Mrs.Buchanana(they are my pts)   Very active, stationary bike sometimes     Family History  Problem Relation Age of Onset  . Coronary artery disease Father      mid 7440's  . Diabetes Neg Hx   . Stroke Neg Hx   . Colon cancer Neg Hx   . Breast cancer Maternal Grandmother   . Breast cancer      aunt  . Heart disease Mother 50    ROS: no fevers or chills, productive cough, hemoptysis, dysphasia, odynophagia, melena, hematochezia, dysuria, hematuria, rash, seizure activity, orthopnea, PND, pedal edema, claudication. Remaining systems are negative.  Physical Exam:   Blood pressure 130/80, pulse 68, height 5\' 7"  (1.702 m), weight 157 lb (71.215 kg).  General:  Well developed/well nourished in NAD Skin warm/dry Patient not depressed No peripheral clubbing Back-normal HEENT-normal/normal eyelids Neck supple/normal carotid upstroke bilaterally; no bruits; no JVD; no thyromegaly chest - CTA/ normal expansion CV - RRR/normal S1 and S2; no murmurs, rubs or gallops;  PMI nondisplaced Abdomen -NT/ND, no HSM, no mass, + bowel sounds, no bruit 2+ femoral pulses, no bruits Ext-no edema, chords, 2+ DP Neuro-grossly nonfocal  ECG sinus rhythm at a rate of 68. No ST changes.

## 2013-03-23 NOTE — Assessment & Plan Note (Signed)
Blood pressure controlled. Continue present medications. 

## 2013-03-23 NOTE — Patient Instructions (Signed)
Your physician recommends that you schedule a follow-up appointment in: AS NEEDED  Your physician has requested that you have an exercise tolerance test. For further information please visit [email or web address] Please also follow instruction sheet, as given.     Exercise Stress Electrocardiogram An exercise stress electrocardiogram is a test to check how blood flows to your heart. It is done to find areas of poor blood flow. You will need to walk on a treadmill for this test. The electrocardiogram will record your heartbeat when you are at rest and when you are exercising. BEFORE THE PROCEDURE  Do not have drinks with caffeine or foods with caffeine for 24 hours before the test, or as told by your doctor.  Follow your doctor's instructions about eating and drinking before the test.  Ask your doctor what medicines you should or should not take before the test. Take your medicines with water unless told by your doctor not to.  If you use an inhaler, bring it with you to the test.  Bring a snack to eat after the test.  Do not  smoke for 4 hours before the test.  Wear comfortable shoes and clothing. PROCEDURE  You will have patches put on your chest. Small areas of your chest may need to be shaved. Wires will be connected to the patches.  Your heart rate will be watched while you are resting and while you are exercising.  You will walk on the treadmill. The treadmill will slowly get faster to raise your heart rate.  The test will take about 1 2 hours. AFTER THE PROCEDURE  Your heart rate and blood pressure will be watched after the test.  You may return to your normal diet, activities, and medicines or as told by your doctor. Document Released: 07/22/2007 Document Revised: 11/23/2012 Document Reviewed: 10/10/2012 [facility] Patient Information 2014 [place].

## 2013-03-23 NOTE — Assessment & Plan Note (Signed)
Symptoms atypical. Strong family history. Schedule exercise treadmill.

## 2013-03-25 ENCOUNTER — Other Ambulatory Visit: Payer: Self-pay | Admitting: Internal Medicine

## 2013-03-28 ENCOUNTER — Inpatient Hospital Stay: Admission: RE | Admit: 2013-03-28 | Payer: 59 | Source: Ambulatory Visit

## 2013-04-10 ENCOUNTER — Telehealth: Payer: Self-pay

## 2013-04-10 NOTE — Telephone Encounter (Signed)
Medication and allergies:  Reviewed and updated  90 day supply/mail order: n/a [facility] pharmacy[facility]  [facility] [id number] - [place], [place] - [address]   Immunizations due: UTD   A/P: No changes to personal, family history or past surgical hx PAP- 03/02/13- negative per patient CCS- 02/17/08- negative MMG- 03/02/13- negative  Flu- 11/22/12 Tdap- 12/17/2004   To Discuss with Provider: Not at this time.

## 2013-04-11 ENCOUNTER — Encounter: Payer: 59 | Admitting: Internal Medicine

## 2013-04-18 ENCOUNTER — Other Ambulatory Visit: Payer: Self-pay | Admitting: *Deleted

## 2013-04-18 ENCOUNTER — Telehealth: Payer: Self-pay

## 2013-04-18 NOTE — Telephone Encounter (Addendum)
Medication and allergies: Reviewed and updated   90 day supply/mail order: n/a  Local pharmacy: [facility]  [address]   Immunizations due: UTD   A/P:  No changes to personal, family history or past surgical hx  PAP- 03/02/13- negative per patient  CCS- 02/17/08- negative  MMG- 03/02/13- negative  Flu- 11/22/12  Tdap- 12/17/2004   To Discuss with Provider:  Not at this time.

## 2013-04-19 ENCOUNTER — Encounter: Payer: Self-pay | Admitting: Internal Medicine

## 2013-04-19 ENCOUNTER — Ambulatory Visit (INDEPENDENT_AMBULATORY_CARE_PROVIDER_SITE_OTHER): Payer: 59 | Admitting: Internal Medicine

## 2013-04-19 ENCOUNTER — Telehealth: Payer: Self-pay | Admitting: Internal Medicine

## 2013-04-19 VITALS — BP 153/93 | HR 88 | Temp 97.9°F | Ht 66.5 in | Wt 158.0 lb

## 2013-04-19 DIAGNOSIS — Z0000 Encounter for general adult medical examination without abnormal findings: Secondary | ICD-10-CM

## 2013-04-19 DIAGNOSIS — E039 Hypothyroidism, unspecified: Secondary | ICD-10-CM

## 2013-04-19 DIAGNOSIS — I10 Essential (primary) hypertension: Secondary | ICD-10-CM

## 2013-04-19 NOTE — Progress Notes (Signed)
Pre visit review using our clinic review tool, if applicable. No additional management support is needed unless otherwise documented below in the visit note. 

## 2013-04-19 NOTE — Assessment & Plan Note (Addendum)
Td 2006 Had a flu shot  Had a bone density test at gynecology, told it was negative. Does not take calcium and vitamin D, encouraged her to do that. PAP- 03/02/13- negative per patient  + FH breast cancer, reports her gynecologist recommended a breast MRI, patient is not sure if she should proceed, I think that is very good recommendation given her family history. MMG- 03/02/13- negative  Cscope 2010, scope done d/t bleed, no report available for review, pt told it was (-), no polyps  Plan-- hemocults, get report if possible  Continue with her healthy lifestyle. Plan

## 2013-04-19 NOTE — Telephone Encounter (Signed)
Relevant patient education mailed to patient.  

## 2013-04-19 NOTE — Assessment & Plan Note (Signed)
Check a TSH 

## 2013-04-19 NOTE — Progress Notes (Signed)
Subjective:    Patient ID: Erin Long, female    DOB: 1955/02/20, 59 y.o.   MRN: [id number]  DOS:  04/19/2013 Type of  visit: CPX Feeling well   ROS Diet-- healthy Exercise-- gym-trainer x 3/week Had  CP saw cards, to have a stress test; no SOB. No palpitations, no lower extremity edema Denies  nausea, vomiting diarrhea No abdominal pain Denies  blood in the stools (-) cough, sputum production (-) wheezing, chest congestion No dysuria, gross hematuria, difficulty urinating  No anxiety, depression   Past Medical History  Diagnosis Date  . Hypertension   . Hypothyroid   . Hot flashes     on effexor    Past Surgical History  Procedure Laterality Date  . Tubal ligation Bilateral   . Foot surgery      twice    History   Social History  . Marital Status: Married    Spouse Name: N/A    Number of Children: 1  . Years of Education: N/A   Occupational History  . stay at home mom    Social History Main Topics  . Smoking status: Never Smoker   . Smokeless tobacco: Never Used  . Alcohol Use: Yes     Comment: socially  . Drug Use: No  . Sexual Activity: Not on file   Other Topics Concern  . Not on file   Social History Narrative   One child one amd 3 Gchildren   Parents Mr. And Mrs.Buchanana(they are my pts)         Family History  Problem Relation Age of Onset  . Coronary artery disease Father     mid 32's  . Diabetes Neg Hx   . Stroke Neg Hx   . Colon cancer Neg Hx   . Breast cancer Maternal Grandmother   . Breast cancer      aunt  . Heart disease Mother 78  . Breast cancer Mother   . Other Mother     blood disorder       Medication List       This list is accurate as of: 04/19/13 11:59 PM.  Always use your most recent med list.               hydrochlorothiazide 12.5 MG capsule  Commonly known as:  MICROZIDE  TAKE 1 CAPSULE BY MOUTH as needed     levothyroxine 100 MCG tablet  Commonly known as:  SYNTHROID, LEVOTHROID  TAKE 1  TABLET BY MOUTH DAILY     omeprazole 40 MG capsule  Commonly known as:  PRILOSEC  TAKE ONE CAPSULE BY MOUTH DAILY     triamterene-hydrochlorothiazide 37.5-25 MG per capsule  Commonly known as:  DYAZIDE  Take 1 capsule by mouth daily as needed (bloated).     venlafaxine XR 37.5 MG 24 hr capsule  Commonly known as:  EFFEXOR-XR  TAKE 1 CAPSULE BY MOUTH EVERY DAY     zaleplon 10 MG capsule  Commonly known as:  SONATA  Take 1 capsule by mouth at bedtime as needed.           Objective:   Physical Exam BP 153/93  Pulse 88  Temp(Src) 97.9 F (36.6 C)  Ht 5' 6.5" (1.689 m)  Wt 158 lb (71.668 kg)  BMI 25.12 kg/m2  SpO2 99% General -- alert, well-developed, NAD.  Neck --no thyromegaly   HEENT-- Not pale.   Lungs -- normal respiratory effort, no intercostal retractions, no accessory muscle use,  and normal breath sounds.  Heart-- normal rate, regular rhythm, no murmur.  Abdomen-- Not distended, good bowel sounds,soft, non-tender.  Extremities-- no pretibial edema bilaterally  Neurologic--  alert & oriented X3. Speech normal, gait normal, strength normal in all extremities.  Psych-- Cognition and judgment appear intact. Cooperative with normal attention span and concentration. No anxious or depressed appearing.        Assessment & Plan:

## 2013-04-19 NOTE — Patient Instructions (Signed)
Please come back fasting: CMP, CBC, TSH, FLP, vitamin D-- dx V70  Check the  blood pressure 2  times a month  be sure it is between 110/60 and 140/85. Ideal blood pressure is 120/80. If it is consistently higher or lower, let me know  Please contact your gastroenterologist and ask them to send us the colonoscopy report  Next visit is for a physical exam in 1 year, fasting Please make an appointment

## 2013-04-19 NOTE — Assessment & Plan Note (Signed)
BP slightly elevated today, at cardiology not long ago BP was normal. Recommend ambulatory BP monitoring. Currently taking hydrochlorothiazide 12.5 mg daily. She only takes Dyazide when she feels bloated.

## 2013-04-20 ENCOUNTER — Other Ambulatory Visit (INDEPENDENT_AMBULATORY_CARE_PROVIDER_SITE_OTHER): Payer: 59

## 2013-04-20 DIAGNOSIS — Z0000 Encounter for general adult medical examination without abnormal findings: Secondary | ICD-10-CM

## 2013-04-20 LAB — CBC WITH DIFFERENTIAL/PLATELET
BASOS PCT: 0.4 % (ref 0.0–3.0)
Basophils Absolute: 0 10*3/uL (ref 0.0–0.1)
EOS PCT: 2.7 % (ref 0.0–5.0)
Eosinophils Absolute: 0.1 10*3/uL (ref 0.0–0.7)
HEMATOCRIT: 43 % (ref 36.0–46.0)
HEMOGLOBIN: 14.2 g/dL (ref 12.0–15.0)
LYMPHS ABS: 1.7 10*3/uL (ref 0.7–4.0)
Lymphocytes Relative: 35.8 % (ref 12.0–46.0)
MCHC: 33 g/dL (ref 30.0–36.0)
MCV: 92.2 fl (ref 78.0–100.0)
Monocytes Absolute: 0.6 10*3/uL (ref 0.1–1.0)
Monocytes Relative: 12 % (ref 3.0–12.0)
Neutro Abs: 2.3 10*3/uL (ref 1.4–7.7)
Neutrophils Relative %: 49.1 % (ref 43.0–77.0)
PLATELETS: 179 10*3/uL (ref 150.0–400.0)
RBC: 4.67 Mil/uL (ref 3.87–5.11)
RDW: 13.1 % (ref 11.5–14.6)
WBC: 4.7 10*3/uL (ref 4.5–10.5)

## 2013-04-20 LAB — LIPID PANEL
Cholesterol: 268 mg/dL — ABNORMAL HIGH (ref 0–200)
HDL: 77.3 mg/dL (ref >39.00)
LDL Cholesterol: 169 mg/dL — ABNORMAL HIGH (ref 0–99)
Total CHOL/HDL Ratio: 3
Triglycerides: 110 mg/dL (ref 0.0–149.0)
VLDL: 22 mg/dL (ref 0.0–40.0)

## 2013-04-20 LAB — COMPREHENSIVE METABOLIC PANEL
ALK PHOS: 85 U/L (ref 39–117)
ALT: 24 U/L (ref 0–35)
AST: 22 U/L (ref 0–37)
Albumin: 4.2 g/dL (ref 3.5–5.2)
BILIRUBIN TOTAL: 1.1 mg/dL (ref 0.3–1.2)
BUN: 18 mg/dL (ref 6–23)
CO2: 27 mEq/L (ref 19–32)
Calcium: 9.4 mg/dL (ref 8.4–10.5)
Chloride: 100 mEq/L (ref 96–112)
Creatinine, Ser: 0.9 mg/dL (ref 0.4–1.2)
GFR: 67.27 mL/min (ref >60.00)
GLUCOSE: 77 mg/dL (ref 70–99)
Potassium: 3.6 mEq/L (ref 3.5–5.1)
SODIUM: 135 meq/L (ref 135–145)
TOTAL PROTEIN: 7.5 g/dL (ref 6.0–8.3)

## 2013-04-20 LAB — TSH: TSH: 2.92 u[IU]/mL (ref 0.35–5.50)

## 2013-04-22 LAB — VITAMIN D 1,25 DIHYDROXY
VITAMIN D 1, 25 (OH) TOTAL: 59 pg/mL (ref 18–72)
Vitamin D3 1, 25 (OH)2: 59 pg/mL

## 2013-04-28 ENCOUNTER — Other Ambulatory Visit: Payer: Self-pay | Admitting: Internal Medicine

## 2013-05-01 ENCOUNTER — Encounter: Payer: 59 | Admitting: Nurse Practitioner

## 2013-06-06 ENCOUNTER — Encounter: Payer: 59 | Admitting: Nurse Practitioner

## 2013-06-28 ENCOUNTER — Ambulatory Visit (INDEPENDENT_AMBULATORY_CARE_PROVIDER_SITE_OTHER): Payer: 59 | Admitting: Nurse Practitioner

## 2013-06-28 ENCOUNTER — Encounter: Payer: Self-pay | Admitting: Nurse Practitioner

## 2013-06-28 VITALS — BP 155/99 | HR 100

## 2013-06-28 DIAGNOSIS — R079 Chest pain, unspecified: Secondary | ICD-10-CM

## 2013-06-28 MED ORDER — METOPROLOL TARTRATE 25 MG PO TABS: 25.0000 mg | ORAL_TABLET | Freq: Two times a day (BID) | ORAL | Status: DC

## 2013-06-28 NOTE — Progress Notes (Signed)
Exercise Treadmill Test  Pre-Exercise Testing Evaluation Rhythm: normal sinus  Rate: 90     Test  Exercise Tolerance Test Ordering MD: Olga MillersBrian Crenshaw, MD  Interpreting MD: Norma FredricksonLori Gerhardt, NP  Unique Test No: 1  Treadmill:  1  Indication for ETT: chest pain - rule out ischemia  Contraindication to ETT: No   Stress Modality: exercise - treadmill  Cardiac Imaging Performed: non   Protocol: standard Bruce - maximal  Max BP:  205/81  Max MPHR (bpm):  165 85% MPR (bpm):  140  MPHR obtained (bpm):  164 % MPHR obtained:  99%  Reached 85% MPHR (min:sec):  4:12 Total Exercise Time (min-sec):  8:45  Workload in METS:  10.1 Borg Scale: 15  Reason ETT Terminated:  patient's desire to stop    ST Segment Analysis At Rest: normal ST segments - no evidence of significant ST depression With Exercise: non-specific ST changes  Other Information Arrhythmia:  No Angina during ETT:  absent (0) Quality of ETT:  diagnostic  ETT Interpretation:  normal - no evidence of ischemia by ST analysis  Comments: Patient presents today for routine GXT. Has had atypical chest pain - evaluated here in February. No prior cardiac history. She has HTN. Strong family history of CAD. Has had recent gum surgery with associated discomfort noted.   Today the patient exercised on the standard Bruce protocol for a total of 8:45 minutes.  Good exercise tolerance.  Hypertensive blood pressure response.  Clinically negative for chest pain. Test was stopped due to fatigue/achievement of target HR - 99% predicted.  EKG negative for ischemia. No significant arrhythmia noted. ST upsloping noted with nonspecific changes - felt to be more reflective of her HTN.   Recommendations: CV risk factor modification.  Add Lopressor 25 mg BID.  We would favor lipid lowering agent - she will discuss with PCP.  See back in a year.   Patient is agreeable to this plan and will call if any problems develop in the interim.   Rosalio MacadamiaLori C.  Gerhardt, RN, ANP-C [facility] [address] [place], [place]  [id number] [phone]

## 2013-06-28 NOTE — Patient Instructions (Signed)
I am adding Lopressor 25 mg two times a day for your blood pressure  Talk to Dr. Drue NovelPaz about your lipids - we would recommend statin therapy  Dr. Jens Somrenshaw will see you in a year  Call the [facility] office at [phone] if you have any questions, problems or concerns.

## 2013-08-02 ENCOUNTER — Telehealth: Payer: Self-pay | Admitting: Internal Medicine

## 2013-08-02 DIAGNOSIS — E785 Hyperlipidemia, unspecified: Secondary | ICD-10-CM

## 2013-08-02 NOTE — Telephone Encounter (Signed)
Lmovm

## 2013-08-02 NOTE — Telephone Encounter (Signed)
Patient is due for a FLP (dx hyperlipidemia) Please arrange

## 2013-08-08 NOTE — Telephone Encounter (Signed)
Spoke with patient, appt scheduled for Wed 08/09/13 for FLP

## 2013-08-09 ENCOUNTER — Telehealth: Payer: Self-pay | Admitting: *Deleted

## 2013-08-09 ENCOUNTER — Other Ambulatory Visit (INDEPENDENT_AMBULATORY_CARE_PROVIDER_SITE_OTHER): Payer: 59

## 2013-08-09 DIAGNOSIS — E785 Hyperlipidemia, unspecified: Secondary | ICD-10-CM

## 2013-08-09 LAB — LIPID PANEL
CHOLESTEROL: 246 mg/dL — AB (ref 0–200)
HDL: 67.7 mg/dL (ref >39.00)
LDL CALC: 160 mg/dL — AB (ref 0–99)
NonHDL: 178.3
TRIGLYCERIDES: 92 mg/dL (ref 0.0–149.0)
Total CHOL/HDL Ratio: 4
VLDL: 18.4 mg/dL (ref 0.0–40.0)

## 2013-08-09 NOTE — Telephone Encounter (Signed)
Spoke  with patient regarding her labs. Pt would like to use a trial medication that's non statin rather than the Lipitor.

## 2013-08-10 MED ORDER — EZETIMIBE 10 MG PO TABS: 10.0000 mg | ORAL_TABLET | Freq: Every day | ORAL | Status: DC

## 2013-08-10 NOTE — Telephone Encounter (Signed)
Done . Pt notified.  

## 2013-08-10 NOTE — Telephone Encounter (Signed)
zetia 10 mg one by mouth daily #30 and 8 refills Schedule a fasting office visit in 6 months

## 2013-08-10 NOTE — Addendum Note (Signed)
Addended by: Eustace QuailEABOLD, Eugena Rhue J on: 08/10/2013 09:00 AM   Modules accepted: Orders

## 2013-08-23 ENCOUNTER — Telehealth: Payer: Self-pay | Admitting: *Deleted

## 2013-08-23 DIAGNOSIS — E785 Hyperlipidemia, unspecified: Secondary | ICD-10-CM

## 2013-08-23 MED ORDER — ATORVASTATIN CALCIUM 10 MG PO TABS: 10.0000 mg | ORAL_TABLET | Freq: Every day | ORAL | Status: DC

## 2013-08-23 NOTE — Telephone Encounter (Signed)
Spoke with patient and advised her to stop taking Zetia and begin Lipitor that Dr. Drue NovelPaz originally recommended. She is agreeable to try lipitor. [facility] [place]

## 2013-08-23 NOTE — Telephone Encounter (Signed)
Caller name:  Merlene LaughterCelia Relation to pt:  self Call back number: [phone]  [facility]  Reason for call: Pt called, is having issues with ezetimibe (ZETIA) 10 MG tablet.  She started taking this aprox 2 weeks ago.  Since then, she is having nightmares, and in "constant state of aggitation".  She has taken dose for today (08/23/13).  Advised pt not to take any more until our office calls her.

## 2013-11-20 ENCOUNTER — Other Ambulatory Visit: Payer: Self-pay | Admitting: Internal Medicine

## 2014-02-17 ENCOUNTER — Telehealth: Payer: Self-pay | Admitting: Internal Medicine

## 2014-02-17 DIAGNOSIS — E785 Hyperlipidemia, unspecified: Secondary | ICD-10-CM

## 2014-02-17 NOTE — Telephone Encounter (Signed)
Started lipitor several months ago. Due for labs . Please arrange

## 2014-02-19 NOTE — Telephone Encounter (Signed)
Letter printed and mailed to Pt, informing her she is due for cholesterol recheck. FLP, AST, ALT ordered.

## 2014-03-31 ENCOUNTER — Other Ambulatory Visit: Payer: Self-pay | Admitting: Nurse Practitioner

## 2014-04-02 NOTE — Telephone Encounter (Signed)
NEEDS TO MAKE ANNUAL APPT WITH Norma FredricksonLori Gerhardt

## 2014-06-02 ENCOUNTER — Other Ambulatory Visit: Payer: Self-pay | Admitting: Internal Medicine

## 2014-06-04 ENCOUNTER — Other Ambulatory Visit: Payer: Self-pay | Admitting: Internal Medicine

## 2014-07-03 ENCOUNTER — Other Ambulatory Visit: Payer: 59

## 2014-07-03 ENCOUNTER — Telehealth: Payer: Self-pay | Admitting: Internal Medicine

## 2014-07-03 ENCOUNTER — Other Ambulatory Visit: Payer: Self-pay

## 2014-07-03 NOTE — Telephone Encounter (Signed)
FLP, AST,ALT were previously ordered on 02/19/2014 for fasting labs.

## 2014-07-03 NOTE — Telephone Encounter (Signed)
Notified pt to come in for labs 07/04/14 in the morning. Orders are in.

## 2014-07-03 NOTE — Telephone Encounter (Signed)
Caller: Erin RightCelia Long, self Ph#: [phone], cell  Pt had letter from January to come back for fasting labs. Lab appt scheduled for 07/04/14 8:45am. She is also scheduled for f/u appt/med refills with Dr. Drue NovelPaz tomorrow at 4:00pm. Please enter lab orders or let me know if pt needs to schedule for later date.

## 2014-07-04 ENCOUNTER — Other Ambulatory Visit: Payer: 59

## 2014-07-04 ENCOUNTER — Ambulatory Visit: Payer: 59 | Admitting: Internal Medicine

## 2014-07-04 DIAGNOSIS — Z0289 Encounter for other administrative examinations: Secondary | ICD-10-CM

## 2014-07-09 ENCOUNTER — Other Ambulatory Visit: Payer: Self-pay | Admitting: Internal Medicine

## 2014-07-09 ENCOUNTER — Other Ambulatory Visit: Payer: Self-pay

## 2014-07-09 ENCOUNTER — Other Ambulatory Visit: Payer: Self-pay | Admitting: Adult Health

## 2014-07-10 ENCOUNTER — Other Ambulatory Visit: Payer: Self-pay | Admitting: Internal Medicine

## 2014-07-10 NOTE — Telephone Encounter (Signed)
Per note on 2.5.15

## 2014-07-13 ENCOUNTER — Other Ambulatory Visit: Payer: Self-pay

## 2014-07-13 ENCOUNTER — Telehealth: Payer: Self-pay | Admitting: Internal Medicine

## 2014-07-13 MED ORDER — VENLAFAXINE HCL ER 37.5 MG PO CP24: 37.5000 mg | ORAL_CAPSULE | Freq: Every day | ORAL | Status: DC

## 2014-07-13 NOTE — Telephone Encounter (Signed)
Venlafaxine (generic Effexor) sent to [facility].

## 2014-07-13 NOTE — Telephone Encounter (Signed)
Caller name: felicia from [facility] Relation to pt: self Call back number: [phone] Pharmacy:   Reason for call:   States that patient would like generic effexor to be called in, not name brand. Cheaper for patient this way

## 2014-07-18 ENCOUNTER — Telehealth: Payer: Self-pay | Admitting: Internal Medicine

## 2014-07-18 NOTE — Telephone Encounter (Signed)
Pre Visit letter sent  °

## 2014-07-19 ENCOUNTER — Telehealth: Payer: Self-pay | Admitting: Internal Medicine

## 2014-07-19 NOTE — Telephone Encounter (Signed)
Pt was no show for appt on 5/18 - rescheduled for 6/22. Charge?

## 2014-07-19 NOTE — Telephone Encounter (Signed)
No, thx 

## 2014-07-26 ENCOUNTER — Other Ambulatory Visit: Payer: Self-pay | Admitting: Internal Medicine

## 2014-08-01 ENCOUNTER — Other Ambulatory Visit: Payer: 59

## 2014-08-06 ENCOUNTER — Other Ambulatory Visit: Payer: Self-pay | Admitting: Internal Medicine

## 2014-08-07 ENCOUNTER — Other Ambulatory Visit: Payer: Self-pay

## 2014-08-08 ENCOUNTER — Encounter: Payer: 59 | Admitting: Internal Medicine

## 2014-08-09 ENCOUNTER — Encounter: Payer: Self-pay | Admitting: Internal Medicine

## 2014-08-09 ENCOUNTER — Telehealth: Payer: Self-pay | Admitting: Internal Medicine

## 2014-08-09 NOTE — Telephone Encounter (Signed)
Pt was no show 08/08/14 3:00pm, CPE appt, pt has not rescheduled, mailing letter, charge?

## 2014-08-09 NOTE — Telephone Encounter (Signed)
No , is okay 

## 2014-08-14 ENCOUNTER — Other Ambulatory Visit: Payer: Self-pay

## 2014-08-14 ENCOUNTER — Ambulatory Visit (INDEPENDENT_AMBULATORY_CARE_PROVIDER_SITE_OTHER): Payer: 59 | Admitting: Internal Medicine

## 2014-08-14 ENCOUNTER — Encounter: Payer: Self-pay | Admitting: Internal Medicine

## 2014-08-14 VITALS — BP 124/68 | HR 62 | Temp 98.0°F | Ht 67.25 in | Wt 154.5 lb

## 2014-08-14 DIAGNOSIS — E785 Hyperlipidemia, unspecified: Secondary | ICD-10-CM | POA: Insufficient documentation

## 2014-08-14 DIAGNOSIS — I10 Essential (primary) hypertension: Secondary | ICD-10-CM

## 2014-08-14 DIAGNOSIS — Z23 Encounter for immunization: Secondary | ICD-10-CM | POA: Diagnosis not present

## 2014-08-14 DIAGNOSIS — Z0000 Encounter for general adult medical examination without abnormal findings: Secondary | ICD-10-CM | POA: Diagnosis not present

## 2014-08-14 DIAGNOSIS — K219 Gastro-esophageal reflux disease without esophagitis: Secondary | ICD-10-CM

## 2014-08-14 LAB — CBC WITH DIFFERENTIAL/PLATELET
BASOS ABS: 0 10*3/uL (ref 0.0–0.1)
BASOS PCT: 0.6 % (ref 0.0–3.0)
EOS PCT: 1.9 % (ref 0.0–5.0)
Eosinophils Absolute: 0.1 10*3/uL (ref 0.0–0.7)
HEMATOCRIT: 41.9 % (ref 36.0–46.0)
HEMOGLOBIN: 14.2 g/dL (ref 12.0–15.0)
LYMPHS ABS: 1.7 10*3/uL (ref 0.7–4.0)
Lymphocytes Relative: 34.4 % (ref 12.0–46.0)
MCHC: 34 g/dL (ref 30.0–36.0)
MCV: 91.8 fl (ref 78.0–100.0)
MONO ABS: 0.6 10*3/uL (ref 0.1–1.0)
MONOS PCT: 12.3 % — AB (ref 3.0–12.0)
NEUTROS ABS: 2.5 10*3/uL (ref 1.4–7.7)
Neutrophils Relative %: 50.8 % (ref 43.0–77.0)
Platelets: 179 10*3/uL (ref 150.0–400.0)
RBC: 4.56 Mil/uL (ref 3.87–5.11)
RDW: 12.8 % (ref 11.5–15.5)
WBC: 4.9 10*3/uL (ref 4.0–10.5)

## 2014-08-14 LAB — COMPREHENSIVE METABOLIC PANEL
ALBUMIN: 4.6 g/dL (ref 3.5–5.2)
ALK PHOS: 82 U/L (ref 39–117)
ALT: 19 U/L (ref 0–35)
AST: 23 U/L (ref 0–37)
BUN: 15 mg/dL (ref 6–23)
CALCIUM: 9.8 mg/dL (ref 8.4–10.5)
CHLORIDE: 101 meq/L (ref 96–112)
CO2: 29 mEq/L (ref 19–32)
Creatinine, Ser: 0.88 mg/dL (ref 0.40–1.20)
GFR: 70.47 mL/min (ref >60.00)
Glucose, Bld: 94 mg/dL (ref 70–99)
Potassium: 3.8 mEq/L (ref 3.5–5.1)
Sodium: 138 mEq/L (ref 135–145)
Total Bilirubin: 0.6 mg/dL (ref 0.2–1.2)
Total Protein: 7.5 g/dL (ref 6.0–8.3)

## 2014-08-14 LAB — TSH: TSH: 3.28 u[IU]/mL (ref 0.35–4.50)

## 2014-08-14 LAB — LIPID PANEL
CHOL/HDL RATIO: 3
CHOLESTEROL: 254 mg/dL — AB (ref 0–200)
HDL: 75.4 mg/dL (ref >39.00)
LDL Cholesterol: 143 mg/dL — ABNORMAL HIGH (ref 0–99)
NonHDL: 178.6
Triglycerides: 176 mg/dL — ABNORMAL HIGH (ref 0.0–149.0)
VLDL: 35.2 mg/dL (ref 0.0–40.0)

## 2014-08-14 LAB — HIV ANTIBODY (ROUTINE TESTING W REFLEX): HIV: NONREACTIVE

## 2014-08-14 MED ORDER — HYDROCHLOROTHIAZIDE 12.5 MG PO CAPS: 12.5000 mg | ORAL_CAPSULE | Freq: Every day | ORAL | Status: DC

## 2014-08-14 MED ORDER — ATORVASTATIN CALCIUM 10 MG PO TABS: 10.0000 mg | ORAL_TABLET | Freq: Every day | ORAL | Status: DC

## 2014-08-14 MED ORDER — VENLAFAXINE HCL ER 37.5 MG PO CP24: 37.5000 mg | ORAL_CAPSULE | Freq: Every day | ORAL | Status: DC

## 2014-08-14 MED ORDER — OMEPRAZOLE 20 MG PO CPDR: 20.0000 mg | DELAYED_RELEASE_CAPSULE | Freq: Every day | ORAL | Status: DC

## 2014-08-14 MED ORDER — METOPROLOL SUCCINATE ER 25 MG PO TB24: 25.0000 mg | ORAL_TABLET | Freq: Every day | ORAL | Status: DC

## 2014-08-14 MED ORDER — LEVOTHYROXINE SODIUM 100 MCG PO TABS: 100.0000 ug | ORAL_TABLET | Freq: Every day | ORAL | Status: DC

## 2014-08-14 NOTE — Progress Notes (Signed)
Subjective:    Patient ID: Erin Long, female    DOB: 05/09/1955, 59 y.o.   MRN: [id number]  DOS:  08/14/2014 Type of visit - description : CPX Interval history: In general feeling well, forgets the second dose of metoprolol frequently. Acid reflux well controlled with daily PPIs, wonders if is necessary to continue taking Prilosec.   Review of Systems Constitutional: No fever. No chills. No unexplained wt changes. No unusual sweats  HEENT: ++ dental problems taking OTCs for pain, no ear discharge, no facial swelling, no voice changes. No eye discharge, no eye  redness , no  intolerance to light   Respiratory: No wheezing , no  difficulty breathing. No cough , no mucus production  Cardiovascular: No CP, no leg swelling , no  Palpitations  GI: no nausea, no vomiting, no diarrhea , no  abdominal pain.  No blood in the stools. No dysphagia, no odynophagia    Endocrine: No polyphagia, no polyuria , no polydipsia  GU: No dysuria, gross hematuria, difficulty urinating. No urinary urgency, no frequency.  Musculoskeletal: No joint swellings or unusual aches or pains  Skin: No change in the color of the skin, palor , no  Rash  Allergic, immunologic: No environmental allergies , no  food allergies  Neurological: No dizziness no  syncope. No headaches. No diplopia, no slurred, no slurred speech, no motor deficits, no facial  Numbness  Hematological: No enlarged lymph nodes, no easy bruising , no unusual bleedings  Psychiatry: No suicidal ideas, no hallucinations, no beavior problems, no confusion.  No unusual/severe anxiety, no depression    Past Medical History  Diagnosis Date  . Hypertension   . Hypothyroid   . Hot flashes     on effexor    Past Surgical History  Procedure Laterality Date  . Tubal ligation Bilateral   . Foot surgery      twice    History   Social History  . Marital Status: Married    Spouse Name: N/A  . Number of Children: 1  . Years of  Education: N/A   Occupational History  . stay at home mom    Social History Main Topics  . Smoking status: Never Smoker   . Smokeless tobacco: Never Used  . Alcohol Use: Yes     Comment: socially  . Drug Use: No  . Sexual Activity: Not on file   Other Topics Concern  . Not on file   Social History Narrative   One child one amd 3 Gchildren   Parents Mr. And Mrs.Wynona Neat      Family History  Problem Relation Age of Onset  . Coronary artery disease Father     mid 9's  . Diabetes Neg Hx   . Stroke Neg Hx   . Colon cancer Neg Hx   . Breast cancer Maternal Grandmother   . Breast cancer      aunt  . Heart disease Mother 61  . Breast cancer Mother   . Other Mother     blood disorder       Medication List       This list is accurate as of: 08/14/14  5:23 PM.  Always use your most recent med list.               atorvastatin 10 MG tablet  Commonly known as:  LIPITOR  Take 1 tablet (10 mg total) by mouth daily.     hydrochlorothiazide 12.5 MG capsule  Commonly known as:  MICROZIDE  Take 1 capsule (12.5 mg total) by mouth daily.     levothyroxine 100 MCG tablet  Commonly known as:  SYNTHROID, LEVOTHROID  Take 1 tablet (100 mcg total) by mouth daily before breakfast.     metoprolol succinate 25 MG 24 hr tablet  Commonly known as:  TOPROL-XL  Take 1 tablet (25 mg total) by mouth daily.     omeprazole 20 MG capsule  Commonly known as:  PRILOSEC  Take 1 capsule (20 mg total) by mouth daily.     triamterene-hydrochlorothiazide 37.5-25 MG per capsule  Commonly known as:  DYAZIDE  Take 1 capsule by mouth daily as needed (bloated).     venlafaxine XR 37.5 MG 24 hr capsule  Commonly known as:  EFFEXOR-XR  Take 1 capsule (37.5 mg total) by mouth daily with breakfast.     zaleplon 10 MG capsule  Commonly known as:  SONATA  Take 1 capsule by mouth at bedtime as needed.           Objective:   Physical Exam BP 124/68 mmHg  Pulse 62  Temp(Src) 98 F (36.7  C) (Oral)  Ht 5' 7.25" (1.708 m)  Wt 154 lb 8 oz (70.081 kg)  BMI 24.02 kg/m2  SpO2 96% General:   Well developed, well nourished . NAD.  Neck:  Full range of motion. Supple. No  thyromegaly , normal carotid pulse HEENT:  Normocephalic . Face symmetric, atraumatic Lungs:  CTA B Normal respiratory effort, no intercostal retractions, no accessory muscle use. Heart: RRR,  no murmur.  No pretibial edema bilaterally  Abdomen:  Not distended, soft, non-tender. No rebound or rigidity. No mass,organomegaly Skin: Exposed areas without rash. Not pale. Not jaundice Neurologic:  alert & oriented X3.  Speech normal, gait appropriate for age and unassisted Strength symmetric and appropriate for age.  Psych: Cognition and judgment appear intact.  Cooperative with normal attention span and concentration.  Behavior appropriate. No anxious or depressed appearing.        Assessment & Plan:

## 2014-08-14 NOTE — Assessment & Plan Note (Signed)
On chronic PPIs, decrease dose to 20 mg, 3 times a week, then as needed. See instructions

## 2014-08-14 NOTE — Assessment & Plan Note (Addendum)
On Metoprolol 25 twice a day but frequently forgets the second dose, not ambulatory BPs. Plan:  Change to metoprolol ER 25 mg daily, check BPs Continue hydrochlorothiazide (Takes Maxzide only as needed for bloating)

## 2014-08-14 NOTE — Patient Instructions (Signed)
Go to the lab  Change metoprolol ER 25 mg one tablet every morning  Change omeprazole to 20 mg: Take 1 a day for a month, then one tablet 3 times a week for a month,  then as needed.  Check the  blood pressure 2 or 3 times  monthly   Be sure your blood pressure is between 110/65 and  145/85.  if it is consistently higher or lower, let me know

## 2014-08-14 NOTE — Assessment & Plan Note (Signed)
Td 2006   Had a bone density test at gynecology, told it was negative.   Encouraged otc vit d qd  Female care - PAPs per gyn   + FH breast cancer,  Due for a MMG, pt aware  Cscope 2010, scope done d/t bleed, no report available for review, pt told it was (-), no polyps    Continue with her healthy lifestyle.

## 2014-08-14 NOTE — Assessment & Plan Note (Signed)
+   family history of heart disease, started Lipitor based on last cholesterol panel. Lifestyle is very good. Plan: Labs

## 2014-08-14 NOTE — Progress Notes (Signed)
Pre visit review using our clinic review tool, if applicable. No additional management support is needed unless otherwise documented below in the visit note. 

## 2014-08-21 MED ORDER — ATORVASTATIN CALCIUM 20 MG PO TABS: 20.0000 mg | ORAL_TABLET | Freq: Every day | ORAL | Status: DC

## 2014-08-21 NOTE — Addendum Note (Signed)
Addended by: Dorette GrateFAULKNER, Deen Deguia C on: 08/21/2014 03:51 PM   Modules accepted: Orders, Medications

## 2014-09-18 NOTE — Progress Notes (Signed)
      HPI: FU chest pain. ETT 5/15 negative adequate. Since last seen, the patient has dyspnea with more extreme activities but not with routine activities. It is relieved with rest. It is not associated with chest pain. There is no orthopnea, PND or pedal edema. There is no syncope or palpitations. There is no exertional chest pain.   Current Outpatient Prescriptions  Medication Sig Dispense Refill  . atorvastatin (LIPITOR) 20 MG tablet Take 1 tablet (20 mg total) by mouth daily. 90 tablet 1  . hydrochlorothiazide (MICROZIDE) 12.5 MG capsule Take 1 capsule (12.5 mg total) by mouth daily. 30 capsule 7  . levothyroxine (SYNTHROID, LEVOTHROID) 100 MCG tablet Take 1 tablet (100 mcg total) by mouth daily before breakfast. 30 tablet 7  . metoprolol succinate (TOPROL-XL) 25 MG 24 hr tablet Take 1 tablet (25 mg total) by mouth daily. 90 tablet 3  . omeprazole (PRILOSEC) 20 MG capsule Take 1 capsule (20 mg total) by mouth daily. 30 capsule 6  . triamterene-hydrochlorothiazide (DYAZIDE) 37.5-25 MG per capsule Take 1 capsule by mouth daily as needed (bloated).     . venlafaxine XR (EFFEXOR-XR) 37.5 MG 24 hr capsule Take 1 capsule (37.5 mg total) by mouth daily with breakfast. 30 capsule 7  . zaleplon (SONATA) 10 MG capsule Take 1 capsule by mouth at bedtime as needed.     No current facility-administered medications for this visit.     Past Medical History  Diagnosis Date  . Hypertension   . Hypothyroid   . Hot flashes     on effexor    Past Surgical History  Procedure Laterality Date  . Tubal ligation Bilateral   . Foot surgery      twice    History   Social History  . Marital Status: Married    Spouse Name: N/A  . Number of Children: 1  . Years of Education: N/A   Occupational History  . stay at home mom    Social History Main Topics  . Smoking status: Never Smoker   . Smokeless tobacco: Never Used  . Alcohol Use: Yes     Comment: socially  . Drug Use: No  . Sexual  Activity: Not on file   Other Topics Concern  . Not on file   Social History Narrative   One child one amd 3 Gchildren   Parents Mr. And Mrs.Buchanan     ROS: no fevers or chills, productive cough, hemoptysis, dysphasia, odynophagia, melena, hematochezia, dysuria, hematuria, rash, seizure activity, orthopnea, PND, pedal edema, claudication. Remaining systems are negative.  Physical Exam: Well-developed well-nourished in no acute distress.  Skin is warm and dry.  HEENT is normal.  Neck is supple.  Chest is clear to auscultation with normal expansion.  Cardiovascular exam is regular rate and rhythm.  Abdominal exam nontender or distended. No masses palpated. Extremities show no edema. neuro grossly intact  ECG sinus rhythm with nonspecific ST changes.

## 2014-09-20 ENCOUNTER — Encounter: Payer: Self-pay | Admitting: Cardiology

## 2014-09-20 ENCOUNTER — Ambulatory Visit (INDEPENDENT_AMBULATORY_CARE_PROVIDER_SITE_OTHER): Payer: 59 | Admitting: Cardiology

## 2014-09-20 VITALS — BP 150/80 | HR 85 | Ht 67.0 in | Wt 154.2 lb

## 2014-09-20 DIAGNOSIS — R072 Precordial pain: Secondary | ICD-10-CM | POA: Diagnosis not present

## 2014-09-20 DIAGNOSIS — I10 Essential (primary) hypertension: Secondary | ICD-10-CM | POA: Diagnosis not present

## 2014-09-20 DIAGNOSIS — E785 Hyperlipidemia, unspecified: Secondary | ICD-10-CM

## 2014-09-20 NOTE — Patient Instructions (Signed)
Your physician recommends that you schedule a follow-up appointment in: 8 WEEKS WITH KRISTIN FOR BLOOD PRESSURE CHECK  Your physician wants you to follow-up in: ONE YEAR WITH DR Shelda Pal will receive a reminder letter in the mail two months in advance. If you don't receive a letter, please call our office to schedule the follow-up appointment.    CALL WITH THE DOSE OF METOPROLOL  BRING BLOOD PRESSURE CUFF TO FOLLOW UP APPOINTMENT

## 2014-09-20 NOTE — Assessment & Plan Note (Signed)
Continue statin. 

## 2014-09-20 NOTE — Assessment & Plan Note (Signed)
No further symptoms. Exercise treadmill negative. 

## 2014-09-20 NOTE — Assessment & Plan Note (Signed)
Blood pressure is mildly elevated. She is not clear if she is taking Toprol 25 or 50 mg daily. I would like for her to be on 50. I have asked her to check her blood pressure at home on that regimen. She will then return in 8 weeks for a blood pressure check and to see if her cuff correlates with ours. Further adjustments at that time.

## 2014-11-22 ENCOUNTER — Ambulatory Visit (INDEPENDENT_AMBULATORY_CARE_PROVIDER_SITE_OTHER): Payer: 59 | Admitting: Pharmacist Clinician (PhC)/ Clinical Pharmacy Specialist

## 2014-11-22 VITALS — BP 156/96 | HR 80 | Ht 67.0 in | Wt 153.8 lb

## 2014-11-22 DIAGNOSIS — I10 Essential (primary) hypertension: Secondary | ICD-10-CM

## 2014-11-22 NOTE — Patient Instructions (Signed)
Return for a a follow up appointment in 1 month  Your blood pressure today is 156/96  (goal <140/90)  Check your blood pressure at home 4-5 times per week and keep record of the readings.  Take your BP meds as follows: stop hydrochlorothiazide capsule and start valsartan 160 mg tablet once daily  Bring all of your meds, your BP cuff and your record of home blood pressures to your next appointment.  Exercise as you're able, try to walk approximately 30 minutes per day.  Keep salt intake to a minimum, especially watch canned and prepared boxed foods.  Eat more fresh fruits and vegetables and fewer canned items.  Avoid eating in fast food restaurants.    HOW TO TAKE YOUR BLOOD PRESSURE: . Rest 5 minutes before taking your blood pressure. .  Don't smoke or drink caffeinated beverages for at least 30 minutes before. . Take your blood pressure before (not after) you eat. . Sit comfortably with your back supported and both feet on the floor (don't cross your legs). . Elevate your arm to heart level on a table or a desk. . Use the proper sized cuff. It should fit smoothly and snugly around your bare upper arm. There should be enough room to slip a fingertip under the cuff. The bottom edge of the cuff should be 1 inch above the crease of the elbow. . Ideally, take 3 measurements at one sitting and record the average.

## 2014-11-23 MED ORDER — VALSARTAN 160 MG PO TABS: 160.0000 mg | ORAL_TABLET | Freq: Every day | ORAL | Status: DC

## 2014-11-25 ENCOUNTER — Encounter: Payer: Self-pay | Admitting: Pharmacist Clinician (PhC)/ Clinical Pharmacy Specialist

## 2014-11-25 NOTE — Progress Notes (Signed)
     11/25/2014 Erin Long [birth year] [id number]   HPI:  Erin Long is a 59 y.o. female patient of Dr Jens Som, with a PMH below who presents today for hypertension clinic evaluation.  She has had hypertension for several years and has been under good control until recently with HCTZ 12.5 mg daily and an occasional Dyazide capsule.    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  Her home cuff is a [employer] and read 15 points higher on both systolic and diastolic.  Current antihypertensive medications: HCTZ 12.5 mg qd, triamterene/hctz 37.5/25 prn, metoprolol succ 50 mg qd   Current Outpatient Prescriptions  Medication Sig Dispense Refill  . atorvastatin (LIPITOR) 20 MG tablet Take 1 tablet (20 mg total) by mouth daily. 90 tablet 1  . hydrochlorothiazide (MICROZIDE) 12.5 MG capsule Take 1 capsule (12.5 mg total) by mouth daily. 30 capsule 7  . levothyroxine (SYNTHROID, LEVOTHROID) 100 MCG tablet Take 1 tablet (100 mcg total) by mouth daily before breakfast. 30 tablet 7  . metoprolol succinate (TOPROL-XL) 25 MG 24 hr tablet Take 1 tablet (25 mg total) by mouth daily. 90 tablet 3  . omeprazole (PRILOSEC) 20 MG capsule Take 1 capsule (20 mg total) by mouth daily. 30 capsule 6  . triamterene-hydrochlorothiazide (DYAZIDE) 37.5-25 MG per capsule Take 1 capsule by mouth daily as needed (bloated).     . valsartan (DIOVAN) 160 MG tablet Take 1 tablet (160 mg total) by mouth daily. 30 tablet 5  . venlafaxine XR (EFFEXOR-XR) 37.5 MG 24 hr capsule Take 1 capsule (37.5 mg total) by mouth daily with breakfast. 30 capsule 7  . zaleplon (SONATA) 10 MG  capsule Take 1 capsule by mouth at bedtime as needed.     No current facility-administered medications for this visit.    Allergies  Allergen Reactions  . Codeine     REACTION: rash    Past Medical History  Diagnosis Date  . Hypertension   . Hypothyroid   . Hot flashes     on effexor    Blood pressure 156/96, pulse 80, height  (1.702 m), weight 153 lb 12.8 oz (69.763 kg).    Phillips Hay PharmD CPP [facility]

## 2014-11-25 NOTE — Assessment & Plan Note (Addendum)
Today her pressure remains elevated, although not as high as her home cuff would indicate.  She is working on lifestyle modifications and was hoping that would be enough.  She takes triamterene/hctz on occasion for bloating, so I am going to stop her hctz 12.5 mg capsule.  Instead will start her on valsartan 160 mg once daily.  She is to continue with home BP checks, although we did recommend that she get a new home cuff.  I will see her back in a month for follow up.

## 2014-12-27 ENCOUNTER — Ambulatory Visit (INDEPENDENT_AMBULATORY_CARE_PROVIDER_SITE_OTHER): Payer: 59 | Admitting: Pharmacist Clinician (PhC)/ Clinical Pharmacy Specialist

## 2014-12-27 VITALS — BP 138/84 | HR 76 | Ht 67.0 in | Wt 157.0 lb

## 2014-12-27 DIAGNOSIS — I10 Essential (primary) hypertension: Secondary | ICD-10-CM

## 2014-12-27 NOTE — Patient Instructions (Signed)
  Your blood pressure today is 138/84  (goal is < 140/90)  Check your blood pressure at home a couple of times each week and keep record of the readings.  Take your BP meds as follows: no changes to medications  Bring all of your meds, your BP cuff and your record of home blood pressures to your next appointment.  Exercise as you're able, try to walk approximately 30 minutes per day.  Keep salt intake to a minimum, especially watch canned and prepared boxed foods.  Eat more fresh fruits and vegetables and fewer canned items.  Avoid eating in fast food restaurants.    HOW TO TAKE YOUR BLOOD PRESSURE: . Rest 5 minutes before taking your blood pressure. .  Don't smoke or drink caffeinated beverages for at least 30 minutes before. . Take your blood pressure before (not after) you eat. . Sit comfortably with your back supported and both feet on the floor (don't cross your legs). . Elevate your arm to heart level on a table or a desk. . Use the proper sized cuff. It should fit smoothly and snugly around your bare upper arm. There should be enough room to slip a fingertip under the cuff. The bottom edge of the cuff should be 1 inch above the crease of the elbow. . Ideally, take 3 measurements at one sitting and record the average.

## 2014-12-28 ENCOUNTER — Encounter: Payer: Self-pay | Admitting: Pharmacist Clinician (PhC)/ Clinical Pharmacy Specialist

## 2014-12-28 NOTE — Assessment & Plan Note (Addendum)
Today her BP is well controlled at 138/84.  Her heart rate is stable at 76.  For now, we will stay on the current regimen of valsartan 160 mg plus hctz 12.5 mg daily.  I have asked her to check her home BP readings on occasion, and especially note what they are at other medical appointments, as her cuff did read somewhat higher in the office.  She is to call if her pressure is noted to be >150/90 on 3 or more occasions

## 2014-12-28 NOTE — Progress Notes (Signed)
     12/28/2014 Erin Rightelia Shuford 01/27/1956 [id number]   HPI:  Erin Long is a 59 y.o. female patient of Dr Jens Somrenshaw, with a PMH below who presents today for hypertension clinic follow up.  She has had hypertension for several years, controlled with just hctz 12.5 mg; at her last visit I asked her to d/c that and start valsartan 160 mg.  She continues to use triamterene/hctz 37.5/25 mg as needed for edema.  She however, mis-read her instructions and discontinued her metoprolol succ 25 mg, continuing with the hctz 12.5.    Cardiac Hx: none significant  Family Hx: mother with hypertension, living at 3985; father died from stroke at 1982, 1 sister with hypertension  Social Hx: never used tobacco, does drink wine most days; caffeine is 1 coffee and 3-4 cups of tea per day  Diet: no added salt, rarely eats fried foods  Exercise: working with personal trainer 3 days per week  Home BP readings: has not taken at home in past month; at her last visit we determined her home cuff read 15 points higher on both systolic and diastolic.  Current antihypertensive medications: HCTZ 12.5 mg qd, triamterene/hctz 37.5/25 prn,  Valsartan 160 mg qd   Current Outpatient Prescriptions  Medication Sig Dispense Refill  . atorvastatin (LIPITOR) 20 MG tablet Take 1 tablet (20 mg total) by mouth daily. 90 tablet 1  . hydrochlorothiazide (MICROZIDE) 12.5 MG capsule Take 1 capsule (12.5 mg total) by mouth daily. 30 capsule 7  . levothyroxine (SYNTHROID, LEVOTHROID) 100 MCG tablet Take 1 tablet (100 mcg total) by mouth daily before breakfast. 30 tablet 7  . omeprazole (PRILOSEC) 20 MG capsule Take 1 capsule (20 mg total) by mouth daily. 30 capsule 6  . triamterene-hydrochlorothiazide (DYAZIDE) 37.5-25 MG per capsule Take 1 capsule by mouth daily as needed (bloated).     . valsartan (DIOVAN) 160 MG tablet Take 1 tablet (160 mg total) by mouth daily. 30 tablet 5  . venlafaxine XR (EFFEXOR-XR) 37.5 MG 24 hr capsule Take 1  capsule (37.5 mg total) by mouth daily with breakfast. 30 capsule 7  . zaleplon (SONATA) 10 MG capsule Take 1 capsule by mouth at bedtime as needed.     No current facility-administered medications for this visit.    Allergies  Allergen Reactions  . Codeine     REACTION: rash    Past Medical History  Diagnosis Date  . Hypertension   . Hypothyroid   . Hot flashes     on effexor    Blood pressure 138/84, pulse 76, height 5\' 7"  (1.702 m), weight 157 lb (71.215 kg).    Phillips HayKristin Nefertiti Mohamad PharmD CPP [facility]

## 2015-01-29 ENCOUNTER — Ambulatory Visit: Payer: 59 | Admitting: Internal Medicine

## 2015-01-29 DIAGNOSIS — Z0289 Encounter for other administrative examinations: Secondary | ICD-10-CM

## 2015-01-30 ENCOUNTER — Telehealth: Payer: Self-pay | Admitting: Internal Medicine

## 2015-01-30 NOTE — Telephone Encounter (Signed)
FYI, would you like to begin dismissal?

## 2015-01-30 NOTE — Telephone Encounter (Signed)
3 no-shows, yes charge.

## 2015-01-30 NOTE — Telephone Encounter (Signed)
Date/Time of No Show: 01/29/15 10:45am Type of Appt: follow up Rescheduled: no, I have not called pt to reschedule # NS in last year: 3  Charge or No Charge?

## 2015-04-01 ENCOUNTER — Other Ambulatory Visit: Payer: Self-pay | Admitting: Internal Medicine

## 2015-04-03 ENCOUNTER — Ambulatory Visit (INDEPENDENT_AMBULATORY_CARE_PROVIDER_SITE_OTHER): Payer: 59 | Admitting: Physician Assistant

## 2015-04-03 ENCOUNTER — Encounter: Payer: Self-pay | Admitting: Physician Assistant

## 2015-04-03 VITALS — BP 160/70 | HR 107 | Temp 99.1°F | Ht 67.0 in | Wt 153.6 lb

## 2015-04-03 DIAGNOSIS — R6889 Other general symptoms and signs: Secondary | ICD-10-CM | POA: Diagnosis not present

## 2015-04-03 LAB — POCT INFLUENZA A/B
Influenza A, POC: NEGATIVE
Influenza B, POC: NEGATIVE

## 2015-04-03 MED ORDER — OSELTAMIVIR PHOSPHATE 75 MG PO CAPS: 75.0000 mg | ORAL_CAPSULE | Freq: Two times a day (BID) | ORAL | Status: DC

## 2015-04-03 NOTE — Patient Instructions (Signed)
You flu test is negative but giving your classic symptoms and sudden onset, we are treating this like the flu.  Please take the Tamiflu as directed. Increase fluids. Continue Nyquil/DAyquil for symptomatic relief. Place a humidifier in the bedroom and get plenty of rest.  Follow-up if symptoms are not resolving or if anything worsens.  Influenza, Adult Influenza (flu) is an infection in the mouth, nose, and throat (respiratory tract) caused by a virus. The flu can make you feel very ill. Influenza spreads easily from person to person (contagious).  HOME CARE   Only take medicines as told by your doctor.  Use a cool mist humidifier to make breathing easier.  Get plenty of rest until your fever goes away. This usually takes 3 to 4 days.  Drink enough fluids to keep your pee (urine) clear or pale yellow.  Cover your mouth and nose when you cough or sneeze.  Wash your hands well to avoid spreading the flu.  Stay home from work or school until your fever has been gone for at least 1 full day.  Get a flu shot every year. GET HELP RIGHT AWAY IF:   You have trouble breathing or feel short of breath.  Your skin or nails turn blue.  You have severe neck pain or stiffness.  You have a severe headache, facial pain, or earache.  Your fever gets worse or keeps coming back.  You feel sick to your stomach (nauseous), throw up (vomit), or have watery poop (diarrhea).  You have chest pain.  You have a deep cough that gets worse, or you cough up more thick spit (mucus). MAKE SURE YOU:   Understand these instructions.  Will watch your condition.  Will get help right away if you are not doing well or get worse.   This information is not intended to replace advice given to you by your health care provider. Make sure you discuss any questions you have with your health care provider.   Document Released: 11/12/2007 Document Revised: 02/23/2014 Document Reviewed: 05/04/2011 Elsevier  Interactive Patient Education [employer]

## 2015-04-03 NOTE — Progress Notes (Signed)
Patient presents to clinic today c/o 2 days of sudden onset of chills, aches, sore throat, sinus pressure and sinus pain with cough. Is unsure of fever. Denies recent travel. Endorses grandson with similar symptoms. Has not had flu shot this year. Has taken Nyquil at night to help with symptoms. Endorses little relief.   Past Medical History  Diagnosis Date  . Hypertension   . Hypothyroid   . Hot flashes     on effexor    Current Outpatient Prescriptions on File Prior to Visit  Medication Sig Dispense Refill  . atorvastatin (LIPITOR) 20 MG tablet Take 1 tablet (20 mg total) by mouth daily. 90 tablet 1  . hydrochlorothiazide (MICROZIDE) 12.5 MG capsule Take 1 capsule (12.5 mg total) by mouth daily. 30 capsule 7  . levothyroxine (SYNTHROID, LEVOTHROID) 100 MCG tablet Take 1 tablet (100 mcg total) by mouth daily before breakfast. 30 tablet 7  . montelukast (SINGULAIR) 10 MG tablet Take 1 tablet (10 mg total) by mouth every evening. 30 tablet 3  . omeprazole (PRILOSEC) 20 MG capsule Take 1 capsule (20 mg total) by mouth daily. 30 capsule 6  . triamterene-hydrochlorothiazide (DYAZIDE) 37.5-25 MG per capsule Take 1 capsule by mouth daily as needed (bloated).     . valsartan (DIOVAN) 160 MG tablet Take 1 tablet (160 mg total) by mouth daily. 30 tablet 5  . venlafaxine XR (EFFEXOR-XR) 37.5 MG 24 hr capsule Take 1 capsule (37.5 mg total) by mouth daily with breakfast. 30 capsule 7  . zaleplon (SONATA) 10 MG capsule Take 1 capsule by mouth at bedtime as needed.     No current facility-administered medications on file prior to visit.    Allergies  Allergen Reactions  . Codeine     REACTION: rash    Family History  Problem Relation Age of Onset  . Coronary artery disease Father     mid 68's  . Diabetes Neg Hx   . Stroke Neg Hx   . Colon cancer Neg Hx   . Breast cancer Maternal Grandmother   . Breast cancer      aunt  . Heart disease Mother 88  . Breast cancer Mother   . Other  Mother     blood disorder    Social History   Social History  . Marital Status: Married    Spouse Name: N/A  . Number of Children: 1  . Years of Education: N/A   Occupational History  . stay at home mom    Social History Main Topics  . Smoking status: Never Smoker   . Smokeless tobacco: Never Used  . Alcohol Use: Yes     Comment: socially  . Drug Use: No  . Sexual Activity: Not Asked   Other Topics Concern  . None   Social History Narrative   One child one amd 3 Gchildren   Parents Mr. And Mrs.Wynona Neat    Review of Systems - See HPI.  All other ROS are negative.  BP 160/70 mmHg  Pulse 107  Temp(Src) 99.1 F (37.3 C) (Oral)  Ht  (1.702 m)  Wt 153 lb 9.6 oz (69.673 kg)  BMI 24.05 kg/m2  SpO2 97%  Physical Exam  Constitutional: She is oriented to person, place, and time and well-developed, well-nourished, and in no distress.  HENT:  Head: Normocephalic and atraumatic.  Right Ear: External ear normal.  Left Ear: External ear normal.  Nose: Nose normal.  Mouth/Throat: Oropharynx is clear and moist. No oropharyngeal exudate.  TM within normal limits bilaterally.  Eyes: Conjunctivae are normal. Pupils are equal, round, and reactive to light.  Neck: Neck supple.  Cardiovascular: Normal rate, regular rhythm, normal heart sounds and intact distal pulses.   Pulmonary/Chest: Effort normal and breath sounds normal. No respiratory distress. She has no wheezes. She has no rales. She exhibits no tenderness.  Neurological: She is alert and oriented to person, place, and time.  Skin: Skin is warm. No rash noted.  Psychiatric: Affect normal.  Vitals reviewed.  Assessment/Plan: Flu-like symptoms Sudden onset. Significant classic symptoms. Potential exposure to flu. Rapid test negative but giving presentation will treat like flu. Rx Tamiflu. Increase fluids. Rest. Tylenol. Continue Nyquil/Dayquil for symptomatic relief. Follow-up if symptoms are not improving.

## 2015-04-03 NOTE — Assessment & Plan Note (Signed)
Sudden onset. Significant classic symptoms. Potential exposure to flu. Rapid test negative but giving presentation will treat like flu. Rx Tamiflu. Increase fluids. Rest. Tylenol. Continue Nyquil/Dayquil for symptomatic relief. Follow-up if symptoms are not improving.

## 2015-04-03 NOTE — Addendum Note (Signed)
Addended by: Verdie Shire on: 04/03/2015 11:59 AM   Modules accepted: Orders

## 2015-04-03 NOTE — Progress Notes (Signed)
Pre visit review using our clinic review tool, if applicable. No additional management support is needed unless otherwise documented below in the visit note. 

## 2015-04-24 ENCOUNTER — Other Ambulatory Visit: Payer: Self-pay | Admitting: Internal Medicine

## 2015-04-26 ENCOUNTER — Other Ambulatory Visit: Payer: Self-pay

## 2015-04-26 MED ORDER — ATORVASTATIN CALCIUM 20 MG PO TABS: 20.0000 mg | ORAL_TABLET | Freq: Every day | ORAL | Status: DC

## 2015-05-29 ENCOUNTER — Other Ambulatory Visit: Payer: Self-pay | Admitting: Cardiology

## 2015-05-29 ENCOUNTER — Other Ambulatory Visit: Payer: Self-pay | Admitting: Internal Medicine

## 2015-05-29 NOTE — Telephone Encounter (Signed)
Rx(s) sent to pharmacy electronically.  

## 2015-06-14 ENCOUNTER — Other Ambulatory Visit: Payer: Self-pay | Admitting: Internal Medicine

## 2015-06-14 NOTE — Telephone Encounter (Signed)
Rx refill Denied, pt must keep appt on 06/18/15 for any further refill authorizations/SLS 04/28

## 2015-06-18 ENCOUNTER — Ambulatory Visit (INDEPENDENT_AMBULATORY_CARE_PROVIDER_SITE_OTHER): Payer: 59 | Admitting: Internal Medicine

## 2015-06-18 ENCOUNTER — Encounter: Payer: Self-pay | Admitting: Internal Medicine

## 2015-06-18 VITALS — BP 110/80 | HR 78 | Temp 97.7°F | Ht 67.0 in | Wt 150.2 lb

## 2015-06-18 DIAGNOSIS — E039 Hypothyroidism, unspecified: Secondary | ICD-10-CM

## 2015-06-18 DIAGNOSIS — I10 Essential (primary) hypertension: Secondary | ICD-10-CM | POA: Diagnosis not present

## 2015-06-18 DIAGNOSIS — Z09 Encounter for follow-up examination after completed treatment for conditions other than malignant neoplasm: Secondary | ICD-10-CM

## 2015-06-18 DIAGNOSIS — Z1159 Encounter for screening for other viral diseases: Secondary | ICD-10-CM | POA: Diagnosis not present

## 2015-06-18 DIAGNOSIS — E785 Hyperlipidemia, unspecified: Secondary | ICD-10-CM

## 2015-06-18 LAB — ALT: ALT: 19 U/L (ref 0–35)

## 2015-06-18 LAB — LIPID PANEL
CHOLESTEROL: 210 mg/dL — AB (ref 0–200)
HDL: 79.7 mg/dL (ref >39.00)
LDL Cholesterol: 95 mg/dL (ref 0–99)
NONHDL: 130.01
Total CHOL/HDL Ratio: 3
Triglycerides: 174 mg/dL — ABNORMAL HIGH (ref 0.0–149.0)
VLDL: 34.8 mg/dL (ref 0.0–40.0)

## 2015-06-18 LAB — BASIC METABOLIC PANEL
BUN: 15 mg/dL (ref 6–23)
CALCIUM: 10.2 mg/dL (ref 8.4–10.5)
CO2: 30 mEq/L (ref 19–32)
Chloride: 101 mEq/L (ref 96–112)
Creatinine, Ser: 0.81 mg/dL (ref 0.40–1.20)
GFR: 77.32 mL/min (ref >60.00)
GLUCOSE: 105 mg/dL — AB (ref 70–99)
POTASSIUM: 3.6 meq/L (ref 3.5–5.1)
SODIUM: 139 meq/L (ref 135–145)

## 2015-06-18 LAB — AST: AST: 21 U/L (ref 0–37)

## 2015-06-18 LAB — TSH: TSH: 2.8 u[IU]/mL (ref 0.35–4.50)

## 2015-06-18 NOTE — Assessment & Plan Note (Signed)
HTN: Continue losartan, HCTZ. Check a BMP. High cholesterol: has a healthy lifestyle, lipitor dose increased to 20 mg last year, check FLP, AST, ALT Hypothyroidism: Continue Synthroid, check a TSH. Hep C screen today RTC 6 months, CPX

## 2015-06-18 NOTE — Patient Instructions (Signed)
GO TO THE LAB :      Get the blood work     GO TO THE FRONT DESK Schedule your next appointment for a yearly checkup in 6 months, fasting

## 2015-06-18 NOTE — Progress Notes (Signed)
Pre visit review using our clinic review tool, if applicable. No additional management support is needed unless otherwise documented below in the visit note. 

## 2015-06-18 NOTE — Progress Notes (Signed)
Subjective:    Patient ID: Erin Long, female    DOB: 17-Nov-1955, 60 y.o.   MRN: [id number]  DOS:  06/18/2015 Type of visit - description : Routine visit Interval history: High cholesterol: Good med compliance, due for labs. HTN: Good med compliance, takes Maxzide sporadically for feeling bloated and swelling usually when she eats salt Hypothyroidism: On meds, due for a [facility] Sees gynecology regularly, they are in charge of prescribing sonata and Effexor   Review of Systems Diet is healthy, she remains very active, has a personal trainer No chest pain difficulty breathing No nausea vomiting.   Past Medical History  Diagnosis Date  . Hypertension   . Hypothyroid   . Hot flashes     on effexor    Past Surgical History  Procedure Laterality Date  . Tubal ligation Bilateral   . Foot surgery      twice    Social History   Social History  . Marital Status: Married    Spouse Name: N/A  . Number of Children: 1  . Years of Education: N/A   Occupational History  . stay at home mom    Social History Main Topics  . Smoking status: Never Smoker   . Smokeless tobacco: Never Used  . Alcohol Use: Yes     Comment: socially  . Drug Use: No  . Sexual Activity: Not on file   Other Topics Concern  . Not on file   Social History Narrative   One child one amd 3 Gchildren   Parents Mr. And Mrs.Wynona Neat         Medication List       This list is accurate as of: 06/18/15  5:32 PM.  Always use your most recent med list.               atorvastatin 20 MG tablet  Commonly known as:  LIPITOR  Take 1 tablet (20 mg total) by mouth daily.     hydrochlorothiazide 12.5 MG capsule  Commonly known as:  MICROZIDE  TAKE 1 CAPSULE BY MOUTH EVERY DAY     levothyroxine 100 MCG tablet  Commonly known as:  SYNTHROID, LEVOTHROID  TAKE 1 TABLET BY MOUTH DAILY BEFORE BREAKFAST     montelukast 10 MG tablet  Commonly known as:  SINGULAIR  Take 1 tablet (10 mg total) by mouth every  evening.     omeprazole 20 MG capsule  Commonly known as:  PRILOSEC  TAKE ONE CAPSULE BY MOUTH DAILY     triamterene-hydrochlorothiazide 37.5-25 MG capsule  Commonly known as:  DYAZIDE  Take 1 capsule by mouth daily as needed (bloated).     valsartan 160 MG tablet  Commonly known as:  DIOVAN  TAKE 1 TABLET(160 MG) BY MOUTH DAILY     venlafaxine XR 37.5 MG 24 hr capsule  Commonly known as:  EFFEXOR-XR  Take 1 capsule (37.5 mg total) by mouth daily with breakfast.     zaleplon 10 MG capsule  Commonly known as:  SONATA  Take 1 capsule by mouth at bedtime as needed.           Objective:   Physical Exam BP 110/80 mmHg  Pulse 78  Temp(Src) 97.7 F (36.5 C) (Oral)  Ht  (1.702 m)  Wt 150 lb 3.2 oz (68.13 kg)  BMI 23.52 kg/m2  SpO2 97% General:   Well developed, well nourished . NAD.  HEENT:  Normocephalic . Face symmetric, atraumatic Lungs:  CTA B Normal  respiratory effort, no intercostal retractions, no accessory muscle use. Heart: RRR,  no murmur.  No pretibial edema bilaterally  Skin: Not pale. Not jaundice Neurologic:  alert & oriented X3.  Speech normal, gait appropriate for age and unassisted Psych--  Cognition and judgment appear intact.  Cooperative with normal attention span and concentration.  Behavior appropriate. No anxious or depressed appearing.      Assessment & Plan:   Assessment HTN -- on valsartan -HCTZ, maxzide only prn swelling Hyperlipidemia  Hypothyroidism GERD Insomnia-- sonata per gyn as of 06-2015 Seasonal allergies  Hot flashes, on Effexor Chest pain, (-)  exercise stress test 2015 BTL  Plan: HTN: Continue losartan, HCTZ. Check a BMP. High cholesterol: has a healthy lifestyle, lipitor dose increased to 20 mg last year, check FLP, AST, ALT Hypothyroidism: Continue Synthroid, check a TSH. Hep C screen today RTC 6 months, CPX

## 2015-06-19 LAB — HEPATITIS C ANTIBODY: HCV Ab: NEGATIVE

## 2015-06-23 ENCOUNTER — Other Ambulatory Visit: Payer: Self-pay | Admitting: Internal Medicine

## 2015-06-27 ENCOUNTER — Other Ambulatory Visit: Payer: Self-pay | Admitting: Internal Medicine

## 2015-12-16 ENCOUNTER — Other Ambulatory Visit: Payer: Self-pay | Admitting: Cardiology

## 2015-12-20 ENCOUNTER — Encounter: Payer: 59 | Admitting: Internal Medicine

## 2015-12-24 ENCOUNTER — Encounter: Payer: Self-pay | Admitting: Cardiology

## 2015-12-24 ENCOUNTER — Telehealth: Payer: Self-pay | Admitting: Internal Medicine

## 2015-12-24 NOTE — Telephone Encounter (Signed)
Thx, no charge

## 2015-12-24 NOTE — Telephone Encounter (Signed)
Pt called in to reschedule her cpe  Pt is very apologetic for missing her appt on 12/20/15. Pt has rescheduled for 01/15/16 at 10:00.

## 2015-12-27 ENCOUNTER — Ambulatory Visit (INDEPENDENT_AMBULATORY_CARE_PROVIDER_SITE_OTHER): Payer: 59 | Admitting: Cardiology

## 2015-12-27 ENCOUNTER — Encounter: Payer: Self-pay | Admitting: Cardiology

## 2015-12-27 VITALS — BP 142/86 | HR 90 | Ht 67.0 in | Wt 154.0 lb

## 2015-12-27 DIAGNOSIS — R072 Precordial pain: Secondary | ICD-10-CM | POA: Diagnosis not present

## 2015-12-27 DIAGNOSIS — E7800 Pure hypercholesterolemia, unspecified: Secondary | ICD-10-CM

## 2015-12-27 DIAGNOSIS — I10 Essential (primary) hypertension: Secondary | ICD-10-CM

## 2015-12-27 NOTE — Patient Instructions (Signed)
Your physician wants you to follow-up in: ONE YEAR WITH DR CRENSHAW You will receive a reminder letter in the mail two months in advance. If you don't receive a letter, please call our office to schedule the follow-up appointment.   If you need a refill on your cardiac medications before your next appointment, please call your pharmacy.  

## 2015-12-27 NOTE — Progress Notes (Signed)
HPI: FU chest pain. ETT 5/15 negative adequate. Since last seen, the patient denies any dyspnea on exertion, orthopnea, PND, pedal edema, palpitations, syncope or chest pain.   Current Outpatient Prescriptions  Medication Sig Dispense Refill  . atorvastatin (LIPITOR) 20 MG tablet Take 1 tablet (20 mg total) by mouth daily. 30 tablet 8  . hydrochlorothiazide (MICROZIDE) 12.5 MG capsule Take 1 capsule (12.5 mg total) by mouth daily. 30 capsule 6  . levothyroxine (SYNTHROID, LEVOTHROID) 100 MCG tablet Take 1 tablet (100 mcg total) by mouth daily before breakfast. 30 tablet 6  . montelukast (SINGULAIR) 10 MG tablet Take 1 tablet (10 mg total) by mouth every evening. 30 tablet 3  . omeprazole (PRILOSEC) 20 MG capsule Take 1 capsule (20 mg total) by mouth daily. 30 capsule 6  . triamterene-hydrochlorothiazide (DYAZIDE) 37.5-25 MG per capsule Take 1 capsule by mouth daily as needed (bloated).     . valsartan (DIOVAN) 160 MG tablet Take 1 tablet (160 mg total) by mouth daily. 30 tablet 0  . venlafaxine XR (EFFEXOR-XR) 37.5 MG 24 hr capsule Take 1 capsule (37.5 mg total) by mouth daily with breakfast. 15 capsule 0  . zaleplon (SONATA) 10 MG capsule Take 1 capsule by mouth at bedtime as needed.     No current facility-administered medications for this visit.      Past Medical History:  Diagnosis Date  . Hot flashes    on effexor  . Hypertension   . Hypothyroid     Past Surgical History:  Procedure Laterality Date  . FOOT SURGERY     twice  . TUBAL LIGATION Bilateral     Social History   Social History  . Marital status: Married    Spouse name: N/A  . Number of children: 1  . 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 Smoking status: Never Smoker  . Smokeless tobacco: Never Used  . Alcohol use Yes     Comment: socially  . Drug use: No  . Sexual activity: Not on file   Other Topics Concern  . Not on  file   Social History Narrative   One child one amd 3 Gchildren   Parents Mr. And Mrs.Wynona NeatBuchanan     Family History  Problem Relation Age of Onset  . Coronary artery disease Father     mid 2740's  . Heart disease Mother 1250  . Breast cancer Mother   . Other Mother     blood disorder  . Breast cancer Maternal Grandmother   . Breast cancer      aunt  . Diabetes Neg Hx   . Stroke Neg Hx   . Colon cancer Neg Hx     ROS: no fevers or chills, productive cough, hemoptysis, dysphasia, odynophagia, melena, hematochezia, dysuria, hematuria, rash, seizure activity, orthopnea, PND, pedal edema, claudication. Remaining systems are negative.  Physical Exam: Well-developed well-nourished in no acute distress.  Skin is warm and dry.  HEENT is normal.  Neck is supple.  Chest is clear to auscultation with normal expansion.  Cardiovascular exam is regular rate and rhythm.  Abdominal exam nontender or distended. No masses palpated. Extremities show no edema. neuro grossly intact  ECG-Sinus rhythm at a rate of 90. Cannot rule out prior anterior infarct.  A/P  1 chest pain-no further symptoms.  2 hypertension-blood pressure is borderline. I have asked her to track this and  we will increase medications as needed.  3 hyperlipidemia-continue statin.  Olga MillersBrian Tyronica Truxillo, MD

## 2016-01-15 ENCOUNTER — Encounter: Payer: Self-pay | Admitting: Internal Medicine

## 2016-01-15 ENCOUNTER — Ambulatory Visit (INDEPENDENT_AMBULATORY_CARE_PROVIDER_SITE_OTHER): Payer: 59 | Admitting: Internal Medicine

## 2016-01-15 VITALS — BP 118/78 | HR 81 | Temp 98.1°F | Resp 14 | Ht 67.0 in | Wt 152.1 lb

## 2016-01-15 DIAGNOSIS — Z0000 Encounter for general adult medical examination without abnormal findings: Secondary | ICD-10-CM

## 2016-01-15 DIAGNOSIS — Z23 Encounter for immunization: Secondary | ICD-10-CM

## 2016-01-15 LAB — BASIC METABOLIC PANEL
BUN: 17 mg/dL (ref 6–23)
CHLORIDE: 100 meq/L (ref 96–112)
CO2: 29 meq/L (ref 19–32)
CREATININE: 0.87 mg/dL (ref 0.40–1.20)
Calcium: 9.7 mg/dL (ref 8.4–10.5)
GFR: 71.05 mL/min (ref >60.00)
GLUCOSE: 97 mg/dL (ref 70–99)
POTASSIUM: 3.7 meq/L (ref 3.5–5.1)
Sodium: 138 mEq/L (ref 135–145)

## 2016-01-15 LAB — TSH: TSH: 1.14 u[IU]/mL (ref 0.35–4.50)

## 2016-01-15 LAB — CBC WITH DIFFERENTIAL/PLATELET
BASOS ABS: 0 10*3/uL (ref 0.0–0.1)
BASOS PCT: 0.5 % (ref 0.0–3.0)
EOS ABS: 0.1 10*3/uL (ref 0.0–0.7)
Eosinophils Relative: 1.2 % (ref 0.0–5.0)
HCT: 41 % (ref 36.0–46.0)
Hemoglobin: 14.3 g/dL (ref 12.0–15.0)
LYMPHS ABS: 2 10*3/uL (ref 0.7–4.0)
Lymphocytes Relative: 35.4 % (ref 12.0–46.0)
MCHC: 34.8 g/dL (ref 30.0–36.0)
MCV: 90.2 fl (ref 78.0–100.0)
Monocytes Absolute: 0.6 10*3/uL (ref 0.1–1.0)
Monocytes Relative: 10.4 % (ref 3.0–12.0)
NEUTROS ABS: 3 10*3/uL (ref 1.4–7.7)
NEUTROS PCT: 52.5 % (ref 43.0–77.0)
PLATELETS: 176 10*3/uL (ref 150.0–400.0)
RBC: 4.55 Mil/uL (ref 3.87–5.11)
RDW: 12.4 % (ref 11.5–15.5)
WBC: 5.8 10*3/uL (ref 4.0–10.5)

## 2016-01-15 LAB — LIPID PANEL
CHOL/HDL RATIO: 3
Cholesterol: 210 mg/dL — ABNORMAL HIGH (ref 0–200)
HDL: 83 mg/dL (ref >39.00)
LDL CALC: 114 mg/dL — AB (ref 0–99)
NonHDL: 126.95
Triglycerides: 65 mg/dL (ref 0.0–149.0)
VLDL: 13 mg/dL (ref 0.0–40.0)

## 2016-01-15 NOTE — Progress Notes (Signed)
Subjective:    Patient ID: Erin Long R Long, female    DOB: [birth year], 60 y.o.   MRN: [id number]  DOS:  01/15/2016 Type of visit - description : CPX Interval history: Feeling great, good medication compliance, no recent ambulatory BPs.   Review of Systems Constitutional: No fever. No chills. No unexplained wt changes. No unusual sweats  HEENT: No dental problems, no ear discharge, no facial swelling, no voice changes. No eye discharge, no eye  redness , no  intolerance to light   Respiratory: No wheezing , no  difficulty breathing. No cough , no mucus production  Cardiovascular: No CP, no leg swelling , no  Palpitations  GI: no nausea, no vomiting, no diarrhea , no  abdominal pain.  No blood in the stools. No dysphagia, no odynophagia    Endocrine: No polyphagia, no polyuria , no polydipsia  GU: No dysuria, gross hematuria, difficulty urinating. No urinary urgency, no frequency.  Musculoskeletal: No joint swellings or unusual aches or pains  Skin: No change in the color of the skin, palor , no  Rash  Allergic, immunologic: No environmental allergies , no  food allergies  Neurological: No dizziness no  syncope. No headaches. No diplopia, no slurred, no slurred speech, no motor deficits, no facial  Numbness  Hematological: No enlarged lymph nodes, no easy bruising , no unusual bleedings  Psychiatry: No suicidal ideas, no hallucinations, no beavior problems, no confusion.  No unusual/severe anxiety, no depression   Past Medical History:  Diagnosis Date  . Hot flashes    on effexor  . Hypertension   . Hypothyroid     Past Surgical History:  Procedure Laterality Date  . FOOT SURGERY     twice  . TUBAL LIGATION Bilateral     Social History   Social History  . Marital status: Married    Spouse name: N/A  . Number of children: 1  . Years of education: N/A   Occupational History  . stay at home mom    Social History Main Topics  . Smoking status: Never  Smoker  . Smokeless tobacco: Never Used  . Alcohol use Yes     Comment: socially  . Drug use: No  . Sexual activity: Not on file   Other Topics Concern  . Not on file   Social History Narrative   One child one and 3 Gchildren   Parents Mr. And Mrs.Wynona NeatBuchanan      Family History  Problem Relation Age of Onset  . Coronary artery disease Father     mid 4640's  . Heart disease Mother 5450  . Breast cancer Mother   . Other Mother     blood disorder  . Breast cancer Maternal Grandmother   . Breast cancer      aunt  . Diabetes Neg Hx   . Stroke Neg Hx   . Colon cancer Neg Hx        Medication List       Accurate as of 01/15/16 11:59 PM. Always use your most recent med list.          atorvastatin 20 MG tablet Commonly known as:  LIPITOR Take 1 tablet (20 mg total) by mouth daily.   hydrochlorothiazide 12.5 MG capsule Commonly known as:  MICROZIDE Take 1 capsule (12.5 mg total) by mouth daily.   levothyroxine 100 MCG tablet Commonly known as:  SYNTHROID, LEVOTHROID Take 1 tablet (100 mcg total) by mouth daily before breakfast.   montelukast 10  MG tablet Commonly known as:  SINGULAIR Take 1 tablet (10 mg total) by mouth every evening.   omeprazole 20 MG capsule Commonly known as:  PRILOSEC Take 1 capsule (20 mg total) by mouth daily.   triamterene-hydrochlorothiazide 37.5-25 MG capsule Commonly known as:  DYAZIDE Take 1 capsule by mouth daily as needed (bloated).   valsartan 160 MG tablet Commonly known as:  DIOVAN Take 1 tablet (160 mg total) by mouth daily.   venlafaxine XR 37.5 MG 24 hr capsule Commonly known as:  EFFEXOR-XR Take 1 capsule (37.5 mg total) by mouth daily with breakfast.   zaleplon 10 MG capsule Commonly known as:  SONATA Take 1 capsule by mouth at bedtime as needed.          Objective:   Physical Exam BP 118/78 (BP Location: Left Arm, Patient Position: Sitting, Cuff Size: Normal)   Pulse 81   Temp 98.1 F (36.7 C) (Oral)   Resp  14   Ht 5\' 7"  (1.702 m)   Wt 152 lb 2 oz (69 kg)   SpO2 98%   BMI 23.83 kg/m   General:   Well developed, well nourished . NAD.  Neck: No  thyromegaly  HEENT:  Normocephalic . Face symmetric, atraumatic Lungs:  CTA B Normal respiratory effort, no intercostal retractions, no accessory muscle use. Heart: RRR,  no murmur.  No pretibial edema bilaterally  Abdomen:  Not distended, soft, non-tender. No rebound or rigidity.   Skin: Exposed areas without rash. Not pale. Not jaundice Neurologic:  alert & oriented X3.  Speech normal, gait appropriate for age and unassisted Strength symmetric and appropriate for age.  Psych: Cognition and judgment appear intact.  Cooperative with normal attention span and concentration.  Behavior appropriate. No anxious or depressed appearing.    Assessment & Plan:   Assessment HTN -- on valsartan -HCTZ, maxzide only prn swelling Hyperlipidemia  Hypothyroidism GERD Insomnia-- sonata per gyn as of 06-2015 Seasonal allergies  Hot flashes, on Effexor Chest pain, (-)  exercise stress test 2015 BTL  PLAN: HTN. BP today is excellent, was a little high at cardiology, recommend ambulatory BPs. Checking labs High cholesterol: On Lipitor 20 mg, last FLP show improved, recheck today. Hypothyroidism: Check a TSH. Continue Synthroid RTC one year.

## 2016-01-15 NOTE — Progress Notes (Signed)
Pre visit review using our clinic review tool, if applicable. No additional management support is needed unless otherwise documented below in the visit note. 

## 2016-01-15 NOTE — Assessment & Plan Note (Signed)
Td 2016; flu shot today   Had a bone density test at gynecology, told it was negative.   Encouraged otc vit d qd  Female care - PAPs- DEXA per gyn   + FH breast cancer,  Schedule for a MMG in few weeks  Cscope 2010, scope done d/t bleed, no report available for review, pt told it was (-), no polyps    She has very healthy lifestyle. Encouraged also carcinoma vitamin D. Has some stress related to her mother's behavior. Counseled

## 2016-01-15 NOTE — Patient Instructions (Signed)
GO TO THE LAB : Get the blood work     GO TO THE FRONT DESK Schedule your next appointment for a complete physical exam in one year   

## 2016-01-16 NOTE — Assessment & Plan Note (Signed)
HTN. BP today is excellent, was a little high at cardiology, recommend ambulatory BPs. Checking labs High cholesterol: On Lipitor 20 mg, last FLP show improved, recheck today. Hypothyroidism: Check a TSH. Continue Synthroid RTC one year.

## 2016-01-17 ENCOUNTER — Other Ambulatory Visit: Payer: Self-pay | Admitting: Cardiology

## 2016-01-31 ENCOUNTER — Other Ambulatory Visit: Payer: Self-pay | Admitting: Internal Medicine

## 2016-02-27 ENCOUNTER — Ambulatory Visit (INDEPENDENT_AMBULATORY_CARE_PROVIDER_SITE_OTHER): Payer: 59 | Admitting: Internal Medicine

## 2016-02-27 ENCOUNTER — Encounter: Payer: Self-pay | Admitting: Internal Medicine

## 2016-02-27 VITALS — BP 128/74 | HR 76 | Temp 98.2°F | Resp 14 | Ht 67.0 in | Wt 154.2 lb

## 2016-02-27 DIAGNOSIS — L03211 Cellulitis of face: Secondary | ICD-10-CM | POA: Diagnosis not present

## 2016-02-27 MED ORDER — CEFUROXIME AXETIL 500 MG PO TABS: 500.0000 mg | ORAL_TABLET | Freq: Two times a day (BID) | ORAL | 0 refills | Status: DC

## 2016-02-27 NOTE — Assessment & Plan Note (Signed)
Cellulitis: Findings c/w cellulitis at the bridge of the nose, no obvious triggers, no openings or wounds. Will treat with antibiotics, definitely let me know or go to the ER if the redness and swelling spreads or is not responding to treatment. Patient in agreement

## 2016-02-27 NOTE — Progress Notes (Signed)
Subjective:    Patient ID: Erin Long, female    DOB: 08/22/1955, 61 y.o.   MRN: [id number]  DOS:  02/27/2016 Type of visit - description : Acute visit Interval history: Few days ago developed TTP, swelling and redness at the bridge of the nose. Denies any injuries. Swelling today is slightly decreased, last night was quite noticeable per pt.   Review of Systems No fever chills No history of previous surgeries in the area Some nasal discharge but denies cough or chest congestion  Past Medical History:  Diagnosis Date  . Hot flashes    on effexor  . Hypertension   . Hypothyroid     Past Surgical History:  Procedure Laterality Date  . FOOT SURGERY     twice  . TUBAL LIGATION Bilateral     Social History   Social History  . Marital status: Married    Spouse name: N/A  . Number of children: 1  . Years of education: N/A   Occupational History  . stay at home mom    Social History Main Topics  . Smoking status: Never Smoker  . Smokeless tobacco: Never Used  . Alcohol use Yes     Comment: socially  . Drug use: No  . Sexual activity: Not on file   Other Topics Concern  . Not on file   Social History Narrative   One child one and 3 Gchildren   Parents Mr. And Mrs.Buchanan       Allergies as of 02/27/2016      Reactions   Codeine    REACTION: rash      Medication List       Accurate as of 02/27/16  4:49 PM. Always use your most recent med list.          atorvastatin 20 MG tablet Commonly known as:  LIPITOR Take 1 tablet (20 mg total) by mouth daily.   cefUROXime 500 MG tablet Commonly known as:  CEFTIN Take 1 tablet (500 mg total) by mouth 2 (two) times daily with a meal.   hydrochlorothiazide 12.5 MG capsule Commonly known as:  MICROZIDE Take 1 capsule (12.5 mg total) by mouth daily.   levothyroxine 100 MCG tablet Commonly known as:  SYNTHROID, LEVOTHROID Take 1 tablet (100 mcg total) by mouth daily before breakfast.   montelukast 10  MG tablet Commonly known as:  SINGULAIR Take 1 tablet (10 mg total) by mouth every evening.   omeprazole 20 MG capsule Commonly known as:  PRILOSEC Take 1 capsule (20 mg total) by mouth daily.   triamterene-hydrochlorothiazide 37.5-25 MG capsule Commonly known as:  DYAZIDE Take 1 capsule by mouth daily as needed (bloated).   valsartan 160 MG tablet Commonly known as:  DIOVAN TAKE 1 TABLET BY MOUTH DAILY   venlafaxine XR 37.5 MG 24 hr capsule Commonly known as:  EFFEXOR-XR Take 1 capsule (37.5 mg total) by mouth daily with breakfast.   zaleplon 10 MG capsule Commonly known as:  SONATA Take 1 capsule by mouth at bedtime as needed.          Objective:   Physical Exam  HENT:  Nose:     BP 128/74 (BP Location: Left Arm, Patient Position: Sitting, Cuff Size: Small)   Pulse 76   Temp 98.2 F (36.8 C) (Oral)   Resp 14   Ht 5\' 7"  (1.702 m)   Wt 154 lb 4 oz (70 kg)   SpO2 98%   BMI 24.16 kg/m  General:  Well developed, well nourished . NAD.  HEENT:  Normocephalic . Face symmetric, atraumatic. TMs normal. Lungs:  CTA B Normal respiratory effort, no intercostal retractions, no accessory muscle use. Heart: RRR,  no murmur.  No pretibial edema bilaterally  Skin: Not pale. Not jaundice Neurologic:  alert & oriented X3.  Speech normal, gait appropriate for age and unassisted Psych--  Cognition and judgment appear intact.  Cooperative with normal attention span and concentration.  Behavior appropriate. No anxious or depressed appearing.      Assessment & Plan:    Assessment HTN -- on valsartan -HCTZ, maxzide only prn swelling Hyperlipidemia  Hypothyroidism GERD Insomnia-- sonata per gyn as of 06-2015 Seasonal allergies  Hot flashes, on Effexor Chest pain, (-)  exercise stress test 2015 BTL  PLAN: Cellulitis: Findings c/w cellulitis at the bridge of the nose, no obvious triggers, no openings or wounds. Will treat with antibiotics, definitely let me know  or go to the ER if the redness and swelling spreads or is not responding to treatment. Patient in agreement

## 2016-02-27 NOTE — Progress Notes (Signed)
Pre visit review using our clinic review tool, if applicable. No additional management support is needed unless otherwise documented below in the visit note. 

## 2016-02-27 NOTE — Patient Instructions (Signed)
  Take antibiotic as prescribed  Use Flonase OTC once daily for nasal congestion  Call if not improving  If you have increase swelling in the face: Call or go to the ER

## 2016-03-30 ENCOUNTER — Other Ambulatory Visit: Payer: Self-pay | Admitting: Internal Medicine

## 2016-04-11 ENCOUNTER — Other Ambulatory Visit: Payer: Self-pay | Admitting: Internal Medicine

## 2016-05-28 LAB — HM MAMMOGRAPHY

## 2016-06-03 LAB — HM PAP SMEAR

## 2016-06-03 LAB — HM DEXA SCAN

## 2016-09-22 ENCOUNTER — Other Ambulatory Visit: Payer: Self-pay | Admitting: Internal Medicine

## 2016-09-22 DIAGNOSIS — I10 Essential (primary) hypertension: Secondary | ICD-10-CM

## 2016-09-22 MED ORDER — LOSARTAN POTASSIUM 50 MG PO TABS: 50.0000 mg | ORAL_TABLET | Freq: Every day | ORAL | 0 refills | Status: DC

## 2016-09-22 NOTE — Telephone Encounter (Signed)
LMOM informing Pt of medication recall on valsartan and recommended medication change to losartan 50mg  1 tab daily ([facility] Informed Pt to call office to schedule lab appt for BMP in 2 weeks and let me know if she has questions/concerns.

## 2016-09-22 NOTE — Telephone Encounter (Signed)
Received refill request for atorvastatin---> will do. However, per med list, Pt on valsartan 160mg . Please advise.

## 2016-09-22 NOTE — Telephone Encounter (Signed)
  Advise patient, will switch valsartan to losartan 50 mg one tablet daily. BMP in 2 weeks.

## 2016-10-28 ENCOUNTER — Other Ambulatory Visit: Payer: Self-pay | Admitting: Internal Medicine

## 2016-11-12 ENCOUNTER — Other Ambulatory Visit (INDEPENDENT_AMBULATORY_CARE_PROVIDER_SITE_OTHER): Payer: 59

## 2016-11-12 ENCOUNTER — Other Ambulatory Visit: Payer: Self-pay | Admitting: Internal Medicine

## 2016-11-12 DIAGNOSIS — I10 Essential (primary) hypertension: Secondary | ICD-10-CM | POA: Diagnosis not present

## 2016-11-12 LAB — BASIC METABOLIC PANEL
BUN: 17 mg/dL (ref 6–23)
CHLORIDE: 100 meq/L (ref 96–112)
CO2: 32 mEq/L (ref 19–32)
CREATININE: 0.75 mg/dL (ref 0.40–1.20)
Calcium: 9.8 mg/dL (ref 8.4–10.5)
GFR: 84.08 mL/min (ref >60.00)
Glucose, Bld: 90 mg/dL (ref 70–99)
Potassium: 4.1 mEq/L (ref 3.5–5.1)
Sodium: 137 mEq/L (ref 135–145)

## 2016-11-12 NOTE — Telephone Encounter (Signed)
Pt called in wondering why only 7 day supply was sent to pharmacy. Informed that we had to change valsartan to losartan back in August d/t recall on valsartan (Pt was aware), informed that I had LMOM informing to call office to schedule lab appt 2-3 weeks after changing medication to check BMP levels, mainly potassium, Pt informed she didn't get message and lab appt scheduled for 11/12/2016 at 1030.

## 2016-11-13 MED ORDER — LOSARTAN POTASSIUM 50 MG PO TABS: 50.0000 mg | ORAL_TABLET | Freq: Every day | ORAL | 5 refills | Status: DC

## 2016-11-13 NOTE — Addendum Note (Signed)
Addended byConrad Lajas D on: 11/13/2016 08:25 AM   Modules accepted: Orders

## 2017-02-21 ENCOUNTER — Other Ambulatory Visit: Payer: Self-pay | Admitting: Internal Medicine

## 2017-03-01 ENCOUNTER — Other Ambulatory Visit: Payer: Self-pay | Admitting: Internal Medicine

## 2017-03-25 ENCOUNTER — Other Ambulatory Visit: Payer: Self-pay | Admitting: Internal Medicine

## 2017-03-31 ENCOUNTER — Other Ambulatory Visit: Payer: Self-pay | Admitting: Internal Medicine

## 2017-04-12 ENCOUNTER — Other Ambulatory Visit: Payer: Self-pay | Admitting: Internal Medicine

## 2017-04-15 ENCOUNTER — Other Ambulatory Visit: Payer: Self-pay | Admitting: Internal Medicine

## 2017-04-26 ENCOUNTER — Other Ambulatory Visit: Payer: Self-pay | Admitting: Internal Medicine

## 2017-04-29 ENCOUNTER — Ambulatory Visit: Payer: 59 | Admitting: Internal Medicine

## 2017-04-30 ENCOUNTER — Other Ambulatory Visit: Payer: Self-pay | Admitting: Internal Medicine

## 2017-04-30 MED ORDER — HYDROCHLOROTHIAZIDE 12.5 MG PO CAPS: 12.5000 mg | ORAL_CAPSULE | Freq: Every day | ORAL | 0 refills | Status: DC

## 2017-04-30 MED ORDER — LEVOTHYROXINE SODIUM 100 MCG PO TABS: ORAL_TABLET | ORAL | 0 refills | Status: DC

## 2017-04-30 NOTE — Telephone Encounter (Signed)
Will send a 7 day supply until Pt is sent. She has a habit of cancelling appt's after receiving refills.

## 2017-04-30 NOTE — Telephone Encounter (Signed)
Copied from CRM [id number]. Topic: Quick Communication - Rx Refill/Question >> Apr 30, 2017 12:08 PM Jonette EvaBarksdale, Harvey B wrote: Pt has an appt on Monday, pt has been completely out and is in need of her medication, contact pt to advise  Medication: triamterene-hydrochlorothiazide (DYAZIDE) 37.5-25 MG per capsule [[id number]][[id number]] ,  levothyroxine (SYNTHROID, LEVOTHROID) 100 MCG tablet [[id number]   Has the patient contacted their pharmacy? Yes.     (Agent: If no, request that the patient contact the pharmacy for the refill.)   Preferred Pharmacy (with phone number or street name): [facility]   Agent: Please be advised that RX refills may take up to 3 business days. We ask that you follow-up with your pharmacy.

## 2017-04-30 NOTE — Telephone Encounter (Signed)
Last OV: 12/2015; Upcoming appointment 05/03/17 PCP:[id number] [place] - [phone] [facility] [address] [phone] (Phone) [phone] (Fax)   Patient is out of medications.

## 2017-05-03 ENCOUNTER — Ambulatory Visit: Payer: Managed Care, Other (non HMO) | Admitting: Internal Medicine

## 2017-05-03 ENCOUNTER — Encounter: Payer: Self-pay | Admitting: Internal Medicine

## 2017-05-03 VITALS — BP 140/88 | HR 90 | Temp 98.2°F | Resp 16 | Ht 67.0 in | Wt 155.0 lb

## 2017-05-03 DIAGNOSIS — I10 Essential (primary) hypertension: Secondary | ICD-10-CM

## 2017-05-03 DIAGNOSIS — E039 Hypothyroidism, unspecified: Secondary | ICD-10-CM | POA: Diagnosis not present

## 2017-05-03 DIAGNOSIS — E785 Hyperlipidemia, unspecified: Secondary | ICD-10-CM | POA: Diagnosis not present

## 2017-05-03 NOTE — Patient Instructions (Signed)
   GO TO THE FRONT DESK Schedule a lab appointment for this week, fasting  Schedule your next appointment for a checkup in 1 year.    Check the  blood pressure 2 times a month  Be sure your blood pressure is between 110/65 and  135/85. If it is consistently higher or lower, let me know

## 2017-05-03 NOTE — Progress Notes (Signed)
Subjective:    Patient ID: Erin GibbonCelia R Long, female    DOB: 1955-10-19, 62 y.o.   MRN: [id number]  DOS:  05/03/2017 Type of visit - description : f/u Interval history: No major concerns  HTN: No ambulatory BPs High cholesterol, due for labs, not fasting today Hypothyroidism: Good compliance with medicine, due for labs.   Review of Systems Reports some stress, her husband had open heart surgery, he is doing well. Her mother is my patient, she has a number of medical issues. Overall handling it very well. Minimal if any  lower extremity edema.  On HCTZ as needed  Past Medical History:  Diagnosis Date  . Hot flashes    on effexor  . Hypertension   . Hypothyroid     Past Surgical History:  Procedure Laterality Date  . FOOT SURGERY     twice  . TUBAL LIGATION Bilateral     Social History   Socioeconomic History  . Marital status: Married    Spouse name: Not on file  . Number of children: 1  . Years of education: Not on file  . Highest education level: Not on file  Social Needs  . Financial resource strain: Not on file  . Food insecurity - worry: Not on file  . Food insecurity - inability: Not on file  . Transportation needs - medical: Not on file  . Transportation needs - non-medical: Not on file  Occupational History  . Occupation: stay at home mom  Tobacco Use  . Smoking status: Never Smoker  . Smokeless tobacco: Never Used  Substance and Sexual Activity  . Alcohol use: Yes    Comment: socially  . Drug use: No  . Sexual activity: Not on file  Other Topics Concern  . Not on file  Social History Narrative   One child one and 3 Gchildren   Parents Mr. And Mrs.Buchanan       Allergies as of 05/03/2017      Reactions   Codeine    REACTION: rash      Medication List        Accurate as of 05/03/17 11:59 PM. Always use your most recent med list.          atorvastatin 20 MG tablet Commonly known as:  LIPITOR Take 1 tablet (20 mg total) by mouth  daily.   hydrochlorothiazide 12.5 MG capsule Commonly known as:  MICROZIDE Take 1 capsule (12.5 mg total) by mouth daily.   levothyroxine 100 MCG tablet Commonly known as:  SYNTHROID, LEVOTHROID TAKE 1 TABLET BY MOUTH EVERY DAY BEFORE BREAKFAST   omeprazole 20 MG capsule Commonly known as:  PRILOSEC Take 1 capsule (20 mg total) by mouth daily.   triamterene-hydrochlorothiazide 37.5-25 MG capsule Commonly known as:  DYAZIDE Take 1 capsule by mouth daily as needed (bloated).   valsartan 160 MG tablet Commonly known as:  DIOVAN Take 160 mg by mouth daily.   venlafaxine XR 37.5 MG 24 hr capsule Commonly known as:  EFFEXOR-XR Take 1 capsule (37.5 mg total) by mouth daily with breakfast.   zaleplon 10 MG capsule Commonly known as:  SONATA Take 1 capsule by mouth at bedtime as needed.          Objective:   Physical Exam BP 140/88 (BP Location: Left Arm, Patient Position: Sitting, Cuff Size: Small)   Pulse 90   Temp 98.2 F (36.8 C) (Oral)   Resp 16   Ht 5\' 7"  (1.702 m)   Wt 155  lb (70.3 kg)   SpO2 96%   BMI 24.28 kg/m  General:   Well developed, well nourished . NAD.  HEENT:  Normocephalic . Face symmetric, atraumatic Lungs:  CTA B Normal respiratory effort, no intercostal retractions, no accessory muscle use. Heart: RRR,  no murmur.  No pretibial edema bilaterally  Skin: Not pale. Not jaundice Neurologic:  alert & oriented X3.  Speech normal, gait appropriate for age and unassisted Psych--  Cognition and judgment appear intact.  Cooperative with normal attention span and concentration.  Behavior appropriate. No anxious or depressed appearing.      Assessment & Plan:   Assessment HTN -- on valsartan -HCTZ, maxzide only prn swelling Hyperlipidemia  Hypothyroidism GERD Insomnia-- sonata per gyn as of 06-2015 Seasonal allergies  Hot flashes, on Effexor Chest pain, (-)  exercise stress test 2015 BTL  PLAN: HTN: On Maxide and valsartan  (although  valsartan was not on her med list, added) .  No ambulatory BPs, recommend to check ambulatory BPs.  Check a CMP, CBC L E edema: On prn  HCTZ Hyperlipidemia: On Lipitor, check a FLP Hypothyroidism: On Synthroid, check a TSH Insomnia, hot flashes: On Sonata and Effexor per gynecology RTC 1 year

## 2017-05-04 ENCOUNTER — Other Ambulatory Visit: Payer: Self-pay | Admitting: *Deleted

## 2017-05-04 DIAGNOSIS — E785 Hyperlipidemia, unspecified: Secondary | ICD-10-CM

## 2017-05-04 DIAGNOSIS — E039 Hypothyroidism, unspecified: Secondary | ICD-10-CM

## 2017-05-04 DIAGNOSIS — I10 Essential (primary) hypertension: Secondary | ICD-10-CM

## 2017-05-04 NOTE — Assessment & Plan Note (Signed)
HTN: On Maxide and valsartan  (although valsartan was not on her med list, added) .  No ambulatory BPs, recommend to check ambulatory BPs.  Check a CMP, CBC L E edema: On prn  HCTZ Hyperlipidemia: On Lipitor, check a FLP Hypothyroidism: On Synthroid, check a TSH Insomnia, hot flashes: On Sonata and Effexor per gynecology RTC 1 year

## 2017-05-05 ENCOUNTER — Other Ambulatory Visit: Payer: Managed Care, Other (non HMO)

## 2017-05-06 ENCOUNTER — Other Ambulatory Visit: Payer: Self-pay | Admitting: Internal Medicine

## 2017-05-06 ENCOUNTER — Other Ambulatory Visit (INDEPENDENT_AMBULATORY_CARE_PROVIDER_SITE_OTHER): Payer: Managed Care, Other (non HMO)

## 2017-05-06 DIAGNOSIS — E039 Hypothyroidism, unspecified: Secondary | ICD-10-CM

## 2017-05-06 DIAGNOSIS — E785 Hyperlipidemia, unspecified: Secondary | ICD-10-CM

## 2017-05-06 DIAGNOSIS — I10 Essential (primary) hypertension: Secondary | ICD-10-CM | POA: Diagnosis not present

## 2017-05-06 LAB — LIPID PANEL
Cholesterol: 196 mg/dL (ref 0–200)
HDL: 80.2 mg/dL (ref >39.00)
LDL Cholesterol: 91 mg/dL (ref 0–99)
NonHDL: 116.15
Total CHOL/HDL Ratio: 2
Triglycerides: 127 mg/dL (ref 0.0–149.0)
VLDL: 25.4 mg/dL (ref 0.0–40.0)

## 2017-05-06 LAB — CBC WITH DIFFERENTIAL/PLATELET
BASOS ABS: 0 10*3/uL (ref 0.0–0.1)
Basophils Relative: 0.7 % (ref 0.0–3.0)
EOS PCT: 3.2 % (ref 0.0–5.0)
Eosinophils Absolute: 0.1 10*3/uL (ref 0.0–0.7)
HEMATOCRIT: 40.9 % (ref 36.0–46.0)
HEMOGLOBIN: 14 g/dL (ref 12.0–15.0)
LYMPHS PCT: 38.9 % (ref 12.0–46.0)
Lymphs Abs: 1.8 10*3/uL (ref 0.7–4.0)
MCHC: 34.3 g/dL (ref 30.0–36.0)
MCV: 91.6 fl (ref 78.0–100.0)
MONOS PCT: 10.2 % (ref 3.0–12.0)
Monocytes Absolute: 0.5 10*3/uL (ref 0.1–1.0)
Neutro Abs: 2.1 10*3/uL (ref 1.4–7.7)
Neutrophils Relative %: 47 % (ref 43.0–77.0)
Platelets: 177 10*3/uL (ref 150.0–400.0)
RBC: 4.47 Mil/uL (ref 3.87–5.11)
RDW: 12.7 % (ref 11.5–15.5)
WBC: 4.6 10*3/uL (ref 4.0–10.5)

## 2017-05-06 LAB — COMPREHENSIVE METABOLIC PANEL
ALBUMIN: 4.7 g/dL (ref 3.5–5.2)
ALK PHOS: 72 U/L (ref 39–117)
ALT: 23 U/L (ref 0–35)
AST: 24 U/L (ref 0–37)
BILIRUBIN TOTAL: 0.3 mg/dL (ref 0.2–1.2)
BUN: 15 mg/dL (ref 6–23)
CO2: 30 mEq/L (ref 19–32)
Calcium: 9.8 mg/dL (ref 8.4–10.5)
Chloride: 102 mEq/L (ref 96–112)
Creatinine, Ser: 0.83 mg/dL (ref 0.40–1.20)
GFR: 74.68 mL/min (ref >60.00)
Glucose, Bld: 97 mg/dL (ref 70–99)
POTASSIUM: 3.5 meq/L (ref 3.5–5.1)
Sodium: 141 mEq/L (ref 135–145)
TOTAL PROTEIN: 7.4 g/dL (ref 6.0–8.3)

## 2017-05-06 LAB — TSH: TSH: 1.88 u[IU]/mL (ref 0.35–4.50)

## 2017-05-06 MED ORDER — VALSARTAN 160 MG PO TABS: 160.0000 mg | ORAL_TABLET | Freq: Every day | ORAL | 3 refills | Status: DC

## 2017-05-06 MED ORDER — TRIAMTERENE-HCTZ 37.5-25 MG PO CAPS: 1.0000 | ORAL_CAPSULE | Freq: Every day | ORAL | 3 refills | Status: DC

## 2017-05-06 MED ORDER — VENLAFAXINE HCL ER 37.5 MG PO CP24: 37.5000 mg | ORAL_CAPSULE | Freq: Every day | ORAL | 3 refills | Status: DC

## 2017-05-06 MED ORDER — ATORVASTATIN CALCIUM 20 MG PO TABS: 20.0000 mg | ORAL_TABLET | Freq: Every day | ORAL | 0 refills | Status: DC

## 2017-05-21 ENCOUNTER — Ambulatory Visit: Payer: Self-pay | Admitting: *Deleted

## 2017-05-21 NOTE — Telephone Encounter (Signed)
Pt called to clarify medications; has both Losartan and Valsartan. Instructed she should be taking only the Valsartan.

## 2017-06-08 ENCOUNTER — Other Ambulatory Visit: Payer: Self-pay | Admitting: Internal Medicine

## 2017-07-31 ENCOUNTER — Other Ambulatory Visit: Payer: Self-pay | Admitting: Internal Medicine

## 2017-08-10 ENCOUNTER — Other Ambulatory Visit: Payer: Self-pay | Admitting: Internal Medicine

## 2017-10-21 ENCOUNTER — Encounter: Payer: Self-pay | Admitting: Internal Medicine

## 2017-11-16 ENCOUNTER — Other Ambulatory Visit: Payer: Self-pay

## 2017-11-16 MED ORDER — OMEPRAZOLE 20 MG PO CPDR: 20.0000 mg | DELAYED_RELEASE_CAPSULE | Freq: Every day | ORAL | 3 refills | Status: DC

## 2017-11-16 MED ORDER — HYDROCHLOROTHIAZIDE 12.5 MG PO CAPS: 12.5000 mg | ORAL_CAPSULE | Freq: Every day | ORAL | 3 refills | Status: DC | PRN

## 2017-11-16 MED ORDER — ATORVASTATIN CALCIUM 20 MG PO TABS: 20.0000 mg | ORAL_TABLET | Freq: Every day | ORAL | 3 refills | Status: DC

## 2017-11-16 MED ORDER — LEVOTHYROXINE SODIUM 100 MCG PO TABS: 100.0000 ug | ORAL_TABLET | Freq: Every day | ORAL | 3 refills | Status: DC

## 2017-12-22 ENCOUNTER — Ambulatory Visit: Payer: Managed Care, Other (non HMO) | Admitting: Internal Medicine

## 2017-12-27 ENCOUNTER — Encounter: Payer: Self-pay | Admitting: Internal Medicine

## 2017-12-27 ENCOUNTER — Ambulatory Visit: Payer: Managed Care, Other (non HMO) | Admitting: Internal Medicine

## 2017-12-27 VITALS — BP 126/78 | HR 77 | Temp 97.9°F | Resp 16 | Ht 67.0 in | Wt 156.2 lb

## 2017-12-27 DIAGNOSIS — R600 Localized edema: Secondary | ICD-10-CM

## 2017-12-27 DIAGNOSIS — I10 Essential (primary) hypertension: Secondary | ICD-10-CM | POA: Diagnosis not present

## 2017-12-27 DIAGNOSIS — E785 Hyperlipidemia, unspecified: Secondary | ICD-10-CM

## 2017-12-27 DIAGNOSIS — Z23 Encounter for immunization: Secondary | ICD-10-CM | POA: Diagnosis not present

## 2017-12-27 DIAGNOSIS — E039 Hypothyroidism, unspecified: Secondary | ICD-10-CM

## 2017-12-27 MED ORDER — HYDROCHLOROTHIAZIDE 12.5 MG PO CAPS: 12.5000 mg | ORAL_CAPSULE | Freq: Every day | ORAL | 0 refills | Status: DC | PRN

## 2017-12-27 NOTE — Progress Notes (Signed)
Subjective:    Patient ID: Erin Long, female    DOB: 1956-01-22, 62 y.o.   MRN: [id number]  DOS:  12/27/2017 Type of visit - description : f/u Interval history: HTN: Supposed to be on valsartan and Maxide daily and HCTZ as needed for lower extremity edema she however got confused and in the last several days has only taken valsartan. Hypothyroidism: Good med compliance High cholesterol: On Lipitor.  Due for labs   Review of Systems Denies chest pain or difficulty breathing No nausea, vomiting, diarrhea   Past Medical History:  Diagnosis Date  . Hot flashes    on effexor  . Hypertension   . Hypothyroid     Past Surgical History:  Procedure Laterality Date  . FOOT SURGERY     twice  . TUBAL LIGATION Bilateral     Social History   Socioeconomic History  . Marital status: Married    Spouse name: Not on file  . Number of children: 1  . Years of education: Not on file  . Highest education level: Not on file  Occupational History  . Occupation: stay at home mom  Social Needs  . Financial resource strain: Not on file  . Food insecurity:    Worry: Not on file    Inability: Not on file  . Transportation needs:    Medical: Not on file    Non-medical: Not on file  Tobacco Use  . Smoking status: Never Smoker  . Smokeless tobacco: Never Used  Substance and Sexual Activity  . Alcohol use: Yes    Comment: socially  . Drug use: No  . Sexual activity: Not on file  Lifestyle  . Physical activity:    Days per week: Not on file    Minutes per session: Not on file  . Stress: Not on file  Relationships  . Social connections:    Talks on phone: Not on file    Gets together: Not on file    Attends religious service: Not on file    Active member of club or organization: Not on file    Attends meetings of clubs or organizations: Not on file    Relationship status: Not on file  . Intimate partner violence:    Fear of current or ex partner: Not on file   Emotionally abused: Not on file    Physically abused: Not on file    Forced sexual activity: Not on file  Other Topics Concern  . Not on file  Social History Narrative   One child one and 3 Gchildren   Parents Mr. And Mrs.Buchanan       Allergies as of 12/27/2017      Reactions   Codeine    REACTION: rash      Medication List        Accurate as of 12/27/17 11:59 PM. Always use your most recent med list.          atorvastatin 20 MG tablet Commonly known as:  LIPITOR Take 1 tablet (20 mg total) by mouth daily.   hydrochlorothiazide 12.5 MG capsule Commonly known as:  MICROZIDE Take 1-2 capsules (12.5-25 mg total) by mouth daily as needed.   levothyroxine 100 MCG tablet Commonly known as:  SYNTHROID, LEVOTHROID Take 1 tablet (100 mcg total) by mouth daily before breakfast.   omeprazole 20 MG capsule Commonly known as:  PRILOSEC Take 1 capsule (20 mg total) by mouth daily.   valsartan 160 MG tablet Commonly known as:  DIOVAN Take 1 tablet (160 mg total) by mouth daily.   venlafaxine XR 37.5 MG 24 hr capsule Commonly known as:  EFFEXOR-XR Take 1 capsule (37.5 mg total) by mouth daily with breakfast.   zaleplon 10 MG capsule Commonly known as:  SONATA Take 1 capsule by mouth at bedtime as needed.          Objective:   Physical Exam BP 126/78 (BP Location: Left Arm, Patient Position: Sitting, Cuff Size: Small)   Pulse 77   Temp 97.9 F (36.6 C) (Oral)   Resp 16   Ht 5\' 7"  (1.702 m)   Wt 156 lb 4 oz (70.9 kg)   SpO2 97%   BMI 24.47 kg/m  General:   Well developed, NAD, BMI noted. HEENT:  Normocephalic . Face symmetric, atraumatic Lungs:  CTA B Normal respiratory effort, no intercostal retractions, no accessory muscle use. Heart: RRR,  no murmur.  No pretibial edema bilaterally  Skin: Not pale. Not jaundice Neurologic:  alert & oriented X3.  Speech normal, gait appropriate for age and unassisted Psych--  Cognition and judgment appear intact.    Cooperative with normal attention span and concentration.  Behavior appropriate. No anxious or depressed appearing.      Assessment & Plan:   Assessment HTN LE edema : HCTZ prn Hyperlipidemia  Hypothyroidism GERD Insomnia-- sonata per gyn as of 06-2015 Seasonal allergies  Hot flashes, on Effexor Chest pain, (-)  exercise stress test 2015 BTL  PLAN: HTN: Good compliance with valsartan, for a while she was taking Maxide daily but then she become unsure if she needed to take Maxide or hydrochlorothiazide , eventually stopped diuretics and is only on valsartan for the last 10 days.  BP a week ago was elevated at the gynecologist but is normal today. We agreed to: -stay on valsartan daily for hypertension -DC Maxide -keep HCTZ 12.5 mg 1 or 2 PRN LE  edema. If her BP becomes elevated, will make HCTZ a daily medication. Check a CMP LE edema: HCTZ as needed Hyperlipidemia: On Lipitor, check FLP Hypothyroidism: Check a TSH Insomnia: Hardly ever takes Sonata Hot flashes: On Effexor Preventive care: Flu shot today, Shingrix No. 1 today.  Recent abnormal PAP per pt, f/u per gyn so pt likes to postpone her colonoscopy. RTC 6 months.

## 2017-12-27 NOTE — Patient Instructions (Addendum)
GO TO THE LAB : Get the blood work     GO TO THE FRONT DESK Schedule your next appointment for a checkup in 6 months  Schedule a nurse visit  for your second Shingrix in 2 to 3 months  Take medications as prescribed  For swelling: Hydrochlorothiazide 1 or 2 tablets a day  No more triamterene hydrochlorothiazide (Maxide)     Check the  blood pressure 2 or 3 times a month  Be sure your blood pressure is between 110/65 and  135/85. If it is consistently higher or lower, let me know

## 2017-12-27 NOTE — Progress Notes (Signed)
Pre visit review using our clinic review tool, if applicable. No additional management support is needed unless otherwise documented below in the visit note. 

## 2017-12-28 LAB — TSH: TSH: 1.56 u[IU]/mL (ref 0.35–4.50)

## 2017-12-28 LAB — LIPID PANEL
CHOLESTEROL: 202 mg/dL — AB (ref 0–200)
HDL: 79.1 mg/dL (ref >39.00)
LDL Cholesterol: 102 mg/dL — ABNORMAL HIGH (ref 0–99)
NONHDL: 122.76
Total CHOL/HDL Ratio: 3
Triglycerides: 102 mg/dL (ref 0.0–149.0)
VLDL: 20.4 mg/dL (ref 0.0–40.0)

## 2017-12-28 LAB — COMPREHENSIVE METABOLIC PANEL
ALBUMIN: 4.7 g/dL (ref 3.5–5.2)
ALK PHOS: 71 U/L (ref 39–117)
ALT: 24 U/L (ref 0–35)
AST: 20 U/L (ref 0–37)
BILIRUBIN TOTAL: 0.5 mg/dL (ref 0.2–1.2)
BUN: 16 mg/dL (ref 6–23)
CO2: 31 mEq/L (ref 19–32)
Calcium: 10.9 mg/dL — ABNORMAL HIGH (ref 8.4–10.5)
Chloride: 101 mEq/L (ref 96–112)
Creatinine, Ser: 0.77 mg/dL (ref 0.40–1.20)
GFR: 81.26 mL/min (ref >60.00)
GLUCOSE: 100 mg/dL — AB (ref 70–99)
POTASSIUM: 4.4 meq/L (ref 3.5–5.1)
Sodium: 139 mEq/L (ref 135–145)
TOTAL PROTEIN: 7.3 g/dL (ref 6.0–8.3)

## 2017-12-29 NOTE — Assessment & Plan Note (Signed)
HTN: Good compliance with valsartan, for a while she was taking Maxide daily but then she become unsure if she needed to take Maxide or hydrochlorothiazide , eventually stopped diuretics and is only on valsartan for the last 10 days.  BP a week ago was elevated at the gynecologist but is normal today. We agreed to: -stay on valsartan daily for hypertension -DC Maxide -keep HCTZ 12.5 mg 1 or 2 PRN LE  edema. If her BP becomes elevated, will make HCTZ a daily medication. Check a CMP LE edema: HCTZ as needed Hyperlipidemia: On Lipitor, check FLP Hypothyroidism: Check a TSH Insomnia: Hardly ever takes Sonata Hot flashes: On Effexor Preventive care: Flu shot today, Shingrix No. 1 today.  Recent abnormal PAP per pt, f/u per gyn so pt likes to postpone her colonoscopy. RTC 6 months.

## 2018-03-23 ENCOUNTER — Other Ambulatory Visit: Payer: Self-pay | Admitting: Internal Medicine

## 2018-05-06 ENCOUNTER — Encounter: Payer: Managed Care, Other (non HMO) | Admitting: Internal Medicine

## 2018-05-19 ENCOUNTER — Encounter: Payer: Managed Care, Other (non HMO) | Admitting: Internal Medicine

## 2018-05-19 ENCOUNTER — Ambulatory Visit (INDEPENDENT_AMBULATORY_CARE_PROVIDER_SITE_OTHER): Payer: Managed Care, Other (non HMO)

## 2018-05-19 ENCOUNTER — Other Ambulatory Visit: Payer: Self-pay

## 2018-05-19 DIAGNOSIS — Z23 Encounter for immunization: Secondary | ICD-10-CM

## 2018-06-17 ENCOUNTER — Telehealth: Payer: Self-pay

## 2018-06-17 NOTE — Telephone Encounter (Signed)
Tried calling Pt to offer virtual cpe ([employer])- no answer, unable to leave message.

## 2018-07-19 ENCOUNTER — Telehealth: Payer: Self-pay | Admitting: Internal Medicine

## 2018-07-19 MED ORDER — TELMISARTAN 40 MG PO TABS: 40.0000 mg | ORAL_TABLET | Freq: Every day | ORAL | 2 refills | Status: DC

## 2018-07-19 NOTE — Telephone Encounter (Signed)
Copied from CRM [id number]. Topic: Quick Communication - Rx Refill/Question >> Jul 19, 2018 10:04 AM Zada Girt, Lumin L wrote: Medication: valsartan (DIOVAN) 160 MG tablet (patient says [facility] and her local pharmacy does not have this in stock and she is out of medication as of Sunday 07/17/18)  Has the patient contacted their pharmacy? {yes  (Agent: If no, request that the patient contact the pharmacy for the refill.) (Agent: If yes, when and what did the pharmacy advise?) they were contacted a few days ago but turn out it's not ist[facility]  Preferred Pharmacy (with phone number or street name): unsure  Agent: Please be advised that RX refills may take up to 3 business days. We ask that you follow-up with your pharmacy.

## 2018-07-19 NOTE — Telephone Encounter (Signed)
Okay, send telmisartan 40 mg 1 tablet daily #30 and 3 refills  to the local pharmacy. Advised patient about this medication, she needs to monitor her BPs to be sure the new med is working. She is also due for a CPX, please arrange

## 2018-07-19 NOTE — Telephone Encounter (Signed)
Patient notified of change and cpe scheduled.

## 2018-08-02 ENCOUNTER — Encounter: Payer: Managed Care, Other (non HMO) | Admitting: Internal Medicine

## 2018-08-25 ENCOUNTER — Emergency Department (HOSPITAL_COMMUNITY)
Admission: EM | Admit: 2018-08-25 | Discharge: 2018-08-25 | Disposition: A | Discharge status: Home / Self Care | Payer: Managed Care, Other (non HMO) | Attending: Emergency Medicine | Admitting: Emergency Medicine

## 2018-08-25 ENCOUNTER — Other Ambulatory Visit: Payer: Self-pay

## 2018-08-25 ENCOUNTER — Encounter (HOSPITAL_COMMUNITY): Payer: Self-pay | Admitting: Emergency Medicine

## 2018-08-25 ENCOUNTER — Emergency Department (HOSPITAL_COMMUNITY): Payer: Managed Care, Other (non HMO)

## 2018-08-25 DIAGNOSIS — M542 Cervicalgia: Secondary | ICD-10-CM | POA: Insufficient documentation

## 2018-08-25 DIAGNOSIS — I10 Essential (primary) hypertension: Secondary | ICD-10-CM | POA: Insufficient documentation

## 2018-08-25 DIAGNOSIS — R079 Chest pain, unspecified: Secondary | ICD-10-CM

## 2018-08-25 DIAGNOSIS — R0789 Other chest pain: Secondary | ICD-10-CM | POA: Diagnosis present

## 2018-08-25 DIAGNOSIS — E039 Hypothyroidism, unspecified: Secondary | ICD-10-CM | POA: Insufficient documentation

## 2018-08-25 LAB — BASIC METABOLIC PANEL
Anion gap: 9 (ref 5–15)
BUN: 16 mg/dL (ref 6–20)
CO2: 26 mmol/L (ref 22–32)
Calcium: 9.5 mg/dL (ref 8.9–10.3)
Chloride: 103 mmol/L (ref 98–111)
Creatinine, Ser: 0.78 mg/dL (ref 0.44–1.00)
GFR calc Af Amer: >60 mL/min (ref >60)
GFR calc non Af Amer: >60 mL/min (ref >60)
Glucose, Bld: 103 mg/dL — ABNORMAL HIGH (ref 70–99)
Potassium: 3.6 mmol/L (ref 3.5–5.1)
Sodium: 138 mmol/L (ref 135–145)

## 2018-08-25 LAB — CBC
HCT: 39.8 % (ref 36.0–46.0)
Hemoglobin: 13.5 g/dL (ref 12.0–15.0)
MCH: 31.2 pg (ref 26.0–34.0)
MCHC: 33.9 g/dL (ref 30.0–36.0)
MCV: 91.9 fL (ref 80.0–100.0)
Platelets: 181 10*3/uL (ref 150–400)
RBC: 4.33 MIL/uL (ref 3.87–5.11)
RDW: 11.9 % (ref 11.5–15.5)
WBC: 6.7 10*3/uL (ref 4.0–10.5)
nRBC: 0 % (ref 0.0–0.2)

## 2018-08-25 LAB — TROPONIN I (HIGH SENSITIVITY)
Troponin I (High Sensitivity): 3 ng/L (ref <18)
Troponin I (High Sensitivity): 3 ng/L (ref <18)

## 2018-08-25 LAB — I-STAT BETA HCG BLOOD, ED (MC, WL, AP ONLY): I-stat hCG, quantitative: <5 m[IU]/mL (ref <5)

## 2018-08-25 MED ORDER — SODIUM CHLORIDE 0.9% FLUSH: 3.0000 mL | Freq: Once | INTRAVENOUS | Status: DC

## 2018-08-25 NOTE — Discharge Instructions (Addendum)
You were evaluated in the Emergency Department and after careful evaluation, we did not find any emergent condition requiring admission or further testing in the hospital.  Your symptoms today seem to be due to pain related to acid reflux.  Your testing today was reassuring with no evidence of heart damage.  We still recommend follow-up with your PCP to discuss the need for repeat stress testing as an outpatient.  Please return to the Emergency Department if you experience any worsening of your condition.  We encourage you to follow up with a primary care provider.  Thank you for allowing [place] to be a part of your care.

## 2018-08-25 NOTE — ED Provider Notes (Signed)
[facility] Emergency Department Provider Note MRN:  [id number]  Arrival date & time: 08/25/18     Chief Complaint   Chest pain History of Present Illness   Erin Long is a 63 y.o. year-old female with a history of GERD, hypertension presenting to the ED with chief complaint of chest pain.  Patient began experiencing chest pain at about 5 PM today, sudden onset, at first located in the anterior neck rating down to the central chest, described as a pressure-like pain.  Lasting exactly 34 minutes and then resolving.  Denies dizziness or diaphoresis, no nausea or vomiting, no shortness of breath.  No recent leg pain or swelling.  No history of blood clots, does not smoke.  Review of Systems  A complete 10 system review of systems was obtained and all systems are negative except as noted in the HPI and PMH.   Patient's Health History    Past Medical History:  Diagnosis Date  . Hot flashes    on effexor  . Hypertension   . Hypothyroid     Past Surgical History:  Procedure Laterality Date  . FOOT SURGERY     twice  . TUBAL LIGATION Bilateral     Family History  Problem Relation Age of Onset  . Coronary artery disease Father        mid 37's  . Heart disease Mother 21  . Breast cancer Mother   . Other Mother        blood disorder  . Breast cancer Maternal Grandmother   . Breast cancer Other        aunt  . Diabetes Neg Hx   . Stroke Neg Hx   . Colon cancer Neg Hx     Social History   Socioeconomic History  . Marital status: Married    Spouse name: Not on file  . Number of children: 1  . Years of education: Not on file  . Highest education level: Not on file  Occupational History  . Occupation: stay at home mom  Social Needs  . Financial resource strain: Not on file  . Food insecurity    Worry: Not on file    Inability: Not on file  . Transportation needs    Medical: Not on file    Non-medical: Not on file  Tobacco Use  . Smoking status:  Never Smoker  . Smokeless tobacco: Never Used  Substance and Sexual Activity  . Alcohol use: Yes    Comment: socially  . Drug use: No  . Sexual activity: Not on file  Lifestyle  . Physical activity    Days per week: Not on file    Minutes per session: Not on file  . Stress: Not on file  Relationships  . Social Herbalist on phone: Not on file    Gets together: Not on file    Attends religious service: Not on file    Active member of club or organization: Not on file    Attends meetings of clubs or organizations: Not on file    Relationship status: Not on file  . Intimate partner violence    Fear of current or ex partner: Not on file    Emotionally abused: Not on file    Physically abused: Not on file    Forced sexual activity: Not on file  Other Topics Concern  . Not on file  Social History Narrative   One child one and 3 [place]  Parents Mr. And Mrs.Wynona NeatBuchanan      Physical Exam  Vital Signs and Nursing Notes reviewed Vitals:   08/25/18 2130 08/25/18 2145  BP: (!) 130/95 (!) 174/97  Pulse: 66 70  Resp: 18 18  Temp:    SpO2: 99% 98%    CONSTITUTIONAL: Well-appearing, NAD NEURO:  Alert and oriented x 3, no focal deficits EYES:  eyes equal and reactive ENT/NECK:  no LAD, no JVD CARDIO: Regular rate, well-perfused, normal S1 and S2 PULM:  CTAB no wheezing or rhonchi GI/GU:  normal bowel sounds, non-distended, non-tender MSK/SPINE:  No gross deformities, no edema SKIN:  no rash, atraumatic PSYCH:  Appropriate speech and behavior  Diagnostic and Interventional Summary    EKG Interpretation  Date/Time:  Thursday August 25 2018 17:17:23 EDT Ventricular Rate:  79 PR Interval:  148 QRS Duration: 76 QT Interval:  362 QTC Calculation: 415 R Axis:   42 Text Interpretation:  Normal sinus rhythm Possible Left atrial enlargement Septal infarct , age undetermined Abnormal ECG Confirmed by Kennis CarinaBero, Darvin Dials [phone]([id number]) on 08/25/2018 9:26:39 PM      Labs Reviewed   BASIC METABOLIC PANEL - Abnormal; Notable for the following components:      Result Value   Glucose, Bld 103 (*)    All other components within normal limits  CBC  I-STAT BETA HCG BLOOD, ED ([facility], [facility], [facility] ONLY)  TROPONIN I (HIGH SENSITIVITY)  TROPONIN I (HIGH SENSITIVITY)    DG Chest 2 View  Final Result      Medications  sodium chloride flush (NS) 0.9 % injection 3 mL (has no administration in time range)     Procedures Critical Care  ED Course and Medical Decision Making  I have reviewed the triage vital signs and the nursing notes.  Pertinent labs & imaging results that were available during my care of the patient were reviewed by me and considered in my medical decision making (see below for details).  Atypical chest pain that began in the throat and this 63 year old female with a history of GERD, heart score is low calculated to be 3.  EKG is without any concerning features.  High-sensitivity troponin is negative x2.  There is no evidence of DVT, no hypoxia, no tachycardia, little to no concern for pulmonary embolism.  She is appropriate for discharge with PCP follow-up to discuss need for repeat outpatient stress testing.  After the discussed management above, the patient was determined to be safe for discharge.  The patient was in agreement with this plan and all questions regarding their care were answered.  ED return precautions were discussed and the patient will return to the ED with any significant worsening of condition.  Elmer SowMichael M. Pilar PlateBero, MD [facility] [email or web address]  Final Clinical Impressions(s) / ED Diagnoses     ICD-10-CM   1. Chest pain, unspecified type  R07.9     ED Discharge Orders    None         Sabas SousBero, Lajoya Dombek M, MD 08/25/18 2248

## 2018-08-25 NOTE — ED Triage Notes (Signed)
Pt reports centralized chest pain and throat pain that started when she was at rest. Pain went away to chest, pt only has a headache at present.

## 2018-08-31 ENCOUNTER — Ambulatory Visit (INDEPENDENT_AMBULATORY_CARE_PROVIDER_SITE_OTHER): Payer: Managed Care, Other (non HMO) | Admitting: Internal Medicine

## 2018-08-31 ENCOUNTER — Other Ambulatory Visit: Payer: Self-pay

## 2018-08-31 ENCOUNTER — Encounter: Payer: Self-pay | Admitting: Internal Medicine

## 2018-08-31 ENCOUNTER — Telehealth: Payer: Self-pay

## 2018-08-31 VITALS — BP 143/85 | HR 81 | Temp 98.5°F | Resp 16 | Ht 67.0 in | Wt 153.4 lb

## 2018-08-31 DIAGNOSIS — Z0000 Encounter for general adult medical examination without abnormal findings: Secondary | ICD-10-CM

## 2018-08-31 DIAGNOSIS — E039 Hypothyroidism, unspecified: Secondary | ICD-10-CM

## 2018-08-31 DIAGNOSIS — E785 Hyperlipidemia, unspecified: Secondary | ICD-10-CM

## 2018-08-31 DIAGNOSIS — R079 Chest pain, unspecified: Secondary | ICD-10-CM | POA: Diagnosis not present

## 2018-08-31 DIAGNOSIS — Z0001 Encounter for general adult medical examination with abnormal findings: Secondary | ICD-10-CM | POA: Diagnosis not present

## 2018-08-31 LAB — ALT: ALT: 19 U/L (ref 0–35)

## 2018-08-31 LAB — LIPID PANEL
Cholesterol: 195 mg/dL (ref 0–200)
HDL: 75.4 mg/dL (ref >39.00)
LDL Cholesterol: 105 mg/dL — ABNORMAL HIGH (ref 0–99)
NonHDL: 119.89
Total CHOL/HDL Ratio: 3
Triglycerides: 72 mg/dL (ref 0.0–149.0)
VLDL: 14.4 mg/dL (ref 0.0–40.0)

## 2018-08-31 LAB — AST: AST: 21 U/L (ref 0–37)

## 2018-08-31 LAB — TSH: TSH: 1.32 u[IU]/mL (ref 0.35–4.50)

## 2018-08-31 NOTE — Patient Instructions (Signed)
GO TO THE LAB : Get the blood work     GO TO THE FRONT DESK Schedule your next appointment for a checkup in 6 months   Check the  blood pressure 2 or 3 times a month  Be sure your blood pressure is between 110/65 and  135/85. If it is consistently higher or lower, let me know    

## 2018-08-31 NOTE — Progress Notes (Signed)
Pre visit review using our clinic review tool, if applicable. No additional management support is needed unless otherwise documented below in the visit note. 

## 2018-08-31 NOTE — Telephone Encounter (Signed)
Cologuard ordered through [employer] portal.  

## 2018-08-31 NOTE — Assessment & Plan Note (Addendum)
-  Td 2016 - s/p  shingrex x 2 - encourage flu shot when available  - Female care - PAPs- DEXA per gyn   + FH breast cancer,  s/p MMG 11/2017 @ gyn - [facility]:  Cscope 2010, scope done d/t bleed, no report available for review, pt told it was (-), no polyps . 3 options discussed, elected cologuard, very aware she needs to check when her insurance to verify her out-of-pocket expense.    If not approved by insurance will proceed with iFOBs - lifestyle: Is very good, she exercise regularly. -Recent labs reviewed, will draw LFTs, FLP, TSH.

## 2018-08-31 NOTE — Progress Notes (Signed)
Subjective:    Patient ID: Erin Long, female    DOB: 04-02-1955, 63 y.o.   MRN: [id number]  DOS:  08/31/2018 Type of visit - description: Here for CPX   Went to the ER 08/25/2018, complain of chest pain: Symptoms started at rest, as tightness in the throat and excessive salivation. Then she developed heavy feeling at the chest that radiated to the back also described as a cramp. Lasted 4 to 5 minutes. No associated with shortness of breath, nausea, diaphoresis, GI symptoms, she also denies calf pain or swelling. No further events. At the ER the work-up included a BMP, CBC, troponin, chest x-ray, all okay.  EKG showed no acute changes, she was discharged home  .  BP Readings from Last 3 Encounters:  08/31/18 (!) 143/85  08/25/18 (!) 158/93  12/27/17 126/78     Review of Systems Specifically denies fever chills. Very rarely has DOE when she go to her exercise, outdoors in hot day. GERD controlled  Other than above, a 14 point review of systems is negative    Past Medical History:  Diagnosis Date  . Hot flashes    on effexor  . Hypertension   . Hypothyroid     Past Surgical History:  Procedure Laterality Date  . FOOT SURGERY     twice  . TUBAL LIGATION Bilateral     Social History   Socioeconomic History  . Marital status: Married    Spouse name: Not on file  . Number of children: 1  . Years of education: Not on file  . Highest education level: Not on file  Occupational History  . Occupation: stay at home mom  Social Needs  . Financial resource strain: Not on file  . Food insecurity    Worry: Not on file    Inability: Not on file  . Transportation needs    Medical: Not on file    Non-medical: Not on file  Tobacco Use  . Smoking status: Never Smoker  . Smokeless tobacco: Never Used  Substance and Sexual Activity  . Alcohol use: Yes    Comment: socially  . Drug use: No  . Sexual activity: Not on file  Lifestyle  . Physical activity    Days per  week: Not on file    Minutes per session: Not on file  . Stress: Not on file  Relationships  . Social Musicianconnections    Talks on phone: Not on file    Gets together: Not on file    Attends religious service: Not on file    Active member of club or organization: Not on file    Attends meetings of clubs or organizations: Not on file    Relationship status: Not on file  . Intimate partner violence    Fear of current or ex partner: Not on file    Emotionally abused: Not on file    Physically abused: Not on file    Forced sexual activity: Not on file  Other Topics Concern  . Not on file  Social History Narrative   One child one and 3 Gchildren   Parents Mr. And Mrs.Wynona NeatBuchanan      Family History  Problem Relation Age of Onset  . Coronary artery disease Father        mid 1640's  . Heart disease Mother [id number]  . Breast cancer Mother   . Other Mother        blood disorder  . Breast cancer Maternal Grandmother   .  Breast cancer Other        aunt  . Diabetes Neg Hx   . Stroke Neg Hx   . Colon cancer Neg Hx      Allergies as of 08/31/2018      Reactions   Codeine Rash      Medication List       Accurate as of August 31, 2018 11:59 PM. If you have any questions, ask your nurse or doctor.        STOP taking these medications   valsartan 160 MG tablet Commonly known as: DIOVAN Stopped by: Willow OraJose Erich Kochan, MD     TAKE these medications   atorvastatin 20 MG tablet Commonly known as: LIPITOR Take 1 tablet (20 mg total) by mouth daily.   hydrochlorothiazide 12.5 MG capsule Commonly known as: MICROZIDE Take 1-2 capsules (12.5-25 mg total) by mouth daily as needed. What changed: reasons to take this   levothyroxine 100 MCG tablet Commonly known as: SYNTHROID Take 1 tablet (100 mcg total) by mouth daily before breakfast.   omeprazole 20 MG capsule Commonly known as: PRILOSEC Take 1 capsule (20 mg total) by mouth daily.   telmisartan 40 MG tablet Commonly known as: MICARDIS Take 1  tablet (40 mg total) by mouth daily.   venlafaxine XR 75 MG 24 hr capsule Commonly known as: EFFEXOR-XR Take 75 mg by mouth daily with breakfast. What changed: Another medication with the same name was removed. Continue taking this medication, and follow the directions you see here. Changed by: Willow OraJose Faline Langer, MD   zaleplon 10 MG capsule Commonly known as: SONATA Take 10 mg by mouth at bedtime as needed for sleep.           Objective:   Physical Exam BP (!) 143/85 (BP Location: Left Arm, Patient Position: Sitting, Cuff Size: Small)   Pulse 81   Temp 98.5 F (36.9 C) (Oral)   Resp 16   Ht 5\' 7"  (1.702 m)   Wt 153 lb 6 oz (69.6 kg)   SpO2 97%   BMI 24.02 kg/m  General: Well developed, NAD, BMI noted Neck: No  thyromegaly.  Carotid arteries: Normal pulses bilaterally HEENT:  Normocephalic . Face symmetric, atraumatic Lungs:  CTA B Normal respiratory effort, no intercostal retractions, no accessory muscle use. Heart: RRR,  no murmur.  No pretibial edema bilaterally  Abdomen:  Not distended, soft, non-tender. No rebound or rigidity.   Skin: Exposed areas without rash. Not pale. Not jaundice Neurologic:  alert & oriented X3.  Speech normal, gait appropriate for age and unassisted Strength symmetric and appropriate for age.  Psych: Cognition and judgment appear intact.  Cooperative with normal attention span and concentration.  Behavior appropriate. No anxious or depressed appearing.     Assessment     Assessment HTN LE edema : HCTZ prn Hyperlipidemia  Hypothyroidism GERD Insomnia-- sonata per gyn as of 06-2015 Seasonal allergies  Hot flashes, on Effexor Chest pain, (-)  exercise stress test 2015 BTL  PLAN: HTN: on telmisartan, no ambulatory BPs.  Recommend to check BPs at home, no change. Hyperlipidemia: On Lipitor, checking labs Hypothyroidism: On Synthroid, checking labs Chest pain: As a stated above, at rest, she is physically very active without symptoms.   Negative stress test 2015. Work-up at the ER negative. EKG today: Normal sinus rhythm, essentially similar to the EKG at the ER except for V2. 10 year ASCVD risk 4.4% Discussed options, we agreed on observation, if mild chest pain resurface she will call  me, if she has severe or persistent chest pain: We will go to the ER.  Reassess on RTC RTC 6 months   Today, in addition to her physical exam I spent more than   20 min with the patient: >50% of the time counseling regards chest pain, chronic medical problems, also reviewing the chart and labs ordered by other providers

## 2018-09-01 NOTE — Assessment & Plan Note (Signed)
HTN: on telmisartan, no ambulatory BPs.  Recommend to check BPs at home, no change. Hyperlipidemia: On Lipitor, checking labs Hypothyroidism: On Synthroid, checking labs Chest pain: As a stated above, at rest, she is physically very active without symptoms.  Negative stress test 2015. Work-up at the ER negative. EKG today: Normal sinus rhythm, essentially similar to the EKG at the ER except for V2. 10 year ASCVD risk 4.4% Discussed options, we agreed on observation, if mild chest pain resurface she will call me, if she has severe or persistent chest pain: We will go to the ER.  Reassess on RTC RTC 6 months

## 2018-09-14 ENCOUNTER — Other Ambulatory Visit: Payer: Self-pay | Admitting: Internal Medicine

## 2018-09-16 MED ORDER — TELMISARTAN 40 MG PO TABS: 40.0000 mg | ORAL_TABLET | Freq: Every day | ORAL | 1 refills | Status: DC

## 2018-09-16 NOTE — Telephone Encounter (Signed)
Pt called and needs telmisartan 40 mg [facility]

## 2018-09-16 NOTE — Telephone Encounter (Signed)
Rx sent 

## 2018-09-20 LAB — COLOGUARD: Cologuard: NEGATIVE

## 2018-09-29 ENCOUNTER — Encounter: Payer: Self-pay | Admitting: Internal Medicine

## 2018-10-15 ENCOUNTER — Other Ambulatory Visit: Payer: Self-pay | Admitting: Internal Medicine

## 2019-02-27 ENCOUNTER — Ambulatory Visit (INDEPENDENT_AMBULATORY_CARE_PROVIDER_SITE_OTHER): Payer: Managed Care, Other (non HMO) | Admitting: Internal Medicine

## 2019-02-27 DIAGNOSIS — E039 Hypothyroidism, unspecified: Secondary | ICD-10-CM | POA: Diagnosis not present

## 2019-02-27 DIAGNOSIS — J069 Acute upper respiratory infection, unspecified: Secondary | ICD-10-CM | POA: Diagnosis not present

## 2019-02-27 DIAGNOSIS — Z09 Encounter for follow-up examination after completed treatment for conditions other than malignant neoplasm: Secondary | ICD-10-CM

## 2019-02-27 DIAGNOSIS — I10 Essential (primary) hypertension: Secondary | ICD-10-CM | POA: Diagnosis not present

## 2019-02-27 DIAGNOSIS — F418 Other specified anxiety disorders: Secondary | ICD-10-CM | POA: Insufficient documentation

## 2019-02-27 DIAGNOSIS — F321 Major depressive disorder, single episode, moderate: Secondary | ICD-10-CM | POA: Insufficient documentation

## 2019-02-27 DIAGNOSIS — F329 Major depressive disorder, single episode, unspecified: Secondary | ICD-10-CM | POA: Diagnosis not present

## 2019-02-27 MED ORDER — ESCITALOPRAM OXALATE 10 MG PO TABS: 10.0000 mg | ORAL_TABLET | Freq: Every day | ORAL | 1 refills | Status: DC

## 2019-02-27 NOTE — Progress Notes (Signed)
Subjective:    Patient ID: Erin Long, female    DOB: 10-Mar-1955, 64 y.o.   MRN: [id number]  DOS:  02/27/2019 Type of visit - description: Virtual Visit via Video Note  I connected with the above patient  by a video enabled telemedicine application and verified that I am speaking with the correct person using two identifiers.   THIS ENCOUNTER IS A VIRTUAL VISIT DUE TO COVID-19 - PATIENT WAS NOT SEEN IN THE OFFICE. PATIENT HAS CONSENTED TO VIRTUAL VISIT / TELEMEDICINE VISIT   Location of patient: home  Location of provider: office  I discussed the limitations of evaluation and management by telemedicine and the availability of in person appointments. The patient expressed understanding and agreed to proceed.  Follow-up We evaluated her chronic medical issues: Hypertension and thyroid disease.  Also 2 new issues:  Developed a URI 2 days ago, states is in her typical URI. Runny nose, clear. Dry cough.  Depression: Symptoms are started several months ago, related to the quarantine, feels overwhelmed. Not sleeping well, does take zaleplon as needed. Spoke with her gynecologist and was prescribed Ativan which she takes every morning without much help.    Review of Systems  Denies suicidal ideas No fever chills No chest pain no difficulty breathing No nausea, vomiting, diarrhea.  Past Medical History:  Diagnosis Date  . Hot flashes    on effexor  . Hypertension   . Hypothyroid     Past Surgical History:  Procedure Laterality Date  . FOOT SURGERY     twice  . TUBAL LIGATION Bilateral     Social History   Socioeconomic History  . Marital status: Married    Spouse name: Not on file  . Number of children: 1  . Years of education: Not on file  . Highest education level: Not on file  Occupational History  . Occupation: stay at home mom  Tobacco Use  . Smoking status: Never Smoker  . Smokeless tobacco: Never Used  Substance and Sexual Activity  . Alcohol  use: Yes    Comment: socially  . Drug use: No  . Sexual activity: Not on file  Other Topics Concern  . Not on file  Social History Narrative   One child one and 3 Gchildren   Parents Mr. And Mrs.Wynona Neat    Social Determinants of Health   Financial Resource Strain:   . Difficulty of Paying Living Expenses: Not on file  Food Insecurity:   . Worried About Programme researcher, broadcasting/film/video in the Last Year: Not on file  . Ran Out of Food in the Last Year: Not on file  Transportation Needs:   . Lack of Transportation (Medical): Not on file  . Lack of Transportation (Non-Medical): Not on file  Physical Activity:   . Days of Exercise per Week: Not on file  . Minutes of Exercise per Session: Not on file  Stress:   . Feeling of Stress : Not on file  Social Connections:   . Frequency of Communication with Friends and Family: Not on file  . Frequency of Social Gatherings with Friends and Family: Not on file  . Attends Religious Services: Not on file  . Active Member of Clubs or Organizations: Not on file  . Attends Banker Meetings: Not on file  . Marital Status: Not on file  Intimate Partner Violence:   . Fear of Current or Ex-Partner: Not on file  . Emotionally Abused: Not on file  . Physically  Abused: Not on file  . Sexually Abused: Not on file      Allergies as of 02/27/2019      Reactions   Codeine Rash      Medication List       Accurate as of February 27, 2019  1:02 PM. If you have any questions, ask your nurse or doctor.        atorvastatin 20 MG tablet Commonly known as: LIPITOR Take 1 tablet (20 mg total) by mouth daily.   hydrochlorothiazide 12.5 MG capsule Commonly known as: MICROZIDE Take 1-2 capsules (12.5-25 mg total) by mouth daily as needed. What changed: reasons to take this   levothyroxine 100 MCG tablet Commonly known as: SYNTHROID Take 1 tablet (100 mcg total) by mouth daily before breakfast.   omeprazole 20 MG capsule Commonly known as:  PRILOSEC Take 1 capsule (20 mg total) by mouth daily.   telmisartan 40 MG tablet Commonly known as: MICARDIS Take 1 tablet (40 mg total) by mouth daily.   venlafaxine XR 75 MG 24 hr capsule Commonly known as: EFFEXOR-XR Take 75 mg by mouth daily with breakfast.   zaleplon 10 MG capsule Commonly known as: SONATA Take 10 mg by mouth at bedtime as needed for sleep.           Objective:   Physical Exam There were no vitals taken for this visit. This is a virtual video visit, she is alert oriented x3, no physical distress although I noted mild cough without chest congestion.  She looks somewhat stressed, depressed appearing.    Assessment     Assessment HTN LE edema : HCTZ prn Hyperlipidemia  Hypothyroidism GERD Insomnia-- sonata per gyn as of 06-2015 Seasonal allergies  Hot flashes, on Effexor Chest pain, (-)  exercise stress test 2015 BTL  PLAN: HTN: Controlled.  Good compliance with meds Hypothyroidism: Good compliance with Synthroid, due for labs.  Will do on RTC URI: Symptoms started 2 days ago, no fever chills, + cough/sinus congestion.  Advised patient about Covid testing, she strongly declines.  Recommend to watch closely her symptoms, call anytime if she feels worse or if she has red flag symptoms such as high fever, chest pain, difficulty breathing Depression: New problem, denies suicidal ideas, going on for several months. Related to a number of things including the quarantine and others. Treatment options include increase physical activity, talk to a counselor, daily medications and medications as needed. She is currently taking Ativan prescribed elsewhere without any improvement.  She is also on a low-dose of Effexor for control of hot flashes. We agreed on the following: Switch Effexor to another medication.  Prozac versus Lexapro?  Elected Lexapro, 10 mg 1 tablet daily Likes to hold off on formal counseling. Patient is counseled by me today. For insomnia  zaleplon or Ativan as needed Chest pain: See last visit.  No further symptoms RTC 3-4  weeks in person for reassessment and routine blood work.        I discussed the assessment and treatment plan with the patient. The patient was provided an opportunity to ask q uestions and all were answered. The patient agreed with the plan and demonstrated an understanding of the instructions.   The patient was advised to call back or seek an in-person evaluation if the symptoms worsen or if the condition fails to improve as anticipated.

## 2019-02-27 NOTE — Assessment & Plan Note (Addendum)
HTN: Controlled.  Good compliance with meds Hypothyroidism: Good compliance with Synthroid, due for labs.  Will do on RTC URI: Symptoms started 2 days ago, no fever chills, + cough/sinus congestion.  Advised patient about Covid testing, she strongly declines.  Recommend to watch closely her symptoms, call anytime if she feels worse or if she has red flag symptoms such as high fever, chest pain, difficulty breathing Depression: New problem, denies suicidal ideas, going on for several months. Related to a number of things including the quarantine and others. Treatment options include increase physical activity, talk to a counselor, daily medications and medications as needed. She is currently taking Ativan prescribed elsewhere without any improvement.  She is also on a low-dose of Effexor for control of hot flashes. We agreed on the following: Switch Effexor to another medication.  Prozac versus Lexapro?  Elected Lexapro, 10 mg 1 tablet daily Likes to hold off on formal counseling. Patient is counseled by me today. For insomnia zaleplon or Ativan as needed Chest pain: See last visit.  No further symptoms RTC 3-4  weeks in person for reassessment and routine blood work.

## 2019-03-03 ENCOUNTER — Ambulatory Visit: Payer: Managed Care, Other (non HMO) | Admitting: Internal Medicine

## 2019-03-13 ENCOUNTER — Other Ambulatory Visit: Payer: Self-pay | Admitting: Internal Medicine

## 2019-03-23 ENCOUNTER — Other Ambulatory Visit: Payer: Self-pay

## 2019-03-24 ENCOUNTER — Ambulatory Visit: Payer: Managed Care, Other (non HMO) | Admitting: Internal Medicine

## 2019-03-24 ENCOUNTER — Other Ambulatory Visit: Payer: Self-pay

## 2019-03-27 ENCOUNTER — Encounter: Payer: Self-pay | Admitting: Internal Medicine

## 2019-03-27 ENCOUNTER — Telehealth: Payer: Self-pay

## 2019-03-27 ENCOUNTER — Ambulatory Visit (INDEPENDENT_AMBULATORY_CARE_PROVIDER_SITE_OTHER): Payer: Managed Care, Other (non HMO) | Admitting: Internal Medicine

## 2019-03-27 ENCOUNTER — Other Ambulatory Visit: Payer: Self-pay

## 2019-03-27 VITALS — BP 147/89 | HR 81 | Temp 96.6°F | Resp 16 | Ht 67.0 in | Wt 153.0 lb

## 2019-03-27 DIAGNOSIS — E039 Hypothyroidism, unspecified: Secondary | ICD-10-CM

## 2019-03-27 DIAGNOSIS — K219 Gastro-esophageal reflux disease without esophagitis: Secondary | ICD-10-CM

## 2019-03-27 DIAGNOSIS — F329 Major depressive disorder, single episode, unspecified: Secondary | ICD-10-CM | POA: Diagnosis not present

## 2019-03-27 DIAGNOSIS — G4452 New daily persistent headache (NDPH): Secondary | ICD-10-CM | POA: Diagnosis not present

## 2019-03-27 DIAGNOSIS — I10 Essential (primary) hypertension: Secondary | ICD-10-CM

## 2019-03-27 DIAGNOSIS — Z23 Encounter for immunization: Secondary | ICD-10-CM | POA: Diagnosis not present

## 2019-03-27 DIAGNOSIS — J302 Other seasonal allergic rhinitis: Secondary | ICD-10-CM

## 2019-03-27 LAB — COMPREHENSIVE METABOLIC PANEL
ALT: 22 U/L (ref 0–35)
AST: 21 U/L (ref 0–37)
Albumin: 4.6 g/dL (ref 3.5–5.2)
Alkaline Phosphatase: 103 U/L (ref 39–117)
BUN: 15 mg/dL (ref 6–23)
CO2: 31 mEq/L (ref 19–32)
Calcium: 9.7 mg/dL (ref 8.4–10.5)
Chloride: 100 mEq/L (ref 96–112)
Creatinine, Ser: 0.85 mg/dL (ref 0.40–1.20)
GFR: 67.93 mL/min (ref >60.00)
Glucose, Bld: 85 mg/dL (ref 70–99)
Potassium: 4.3 mEq/L (ref 3.5–5.1)
Sodium: 138 mEq/L (ref 135–145)
Total Bilirubin: 0.5 mg/dL (ref 0.2–1.2)
Total Protein: 7.4 g/dL (ref 6.0–8.3)

## 2019-03-27 LAB — TSH: TSH: 3.08 u[IU]/mL (ref 0.35–4.50)

## 2019-03-27 LAB — SEDIMENTATION RATE: Sed Rate: 6 mm/hr (ref 0–30)

## 2019-03-27 MED ORDER — VENLAFAXINE HCL ER 150 MG PO CP24: 150.0000 mg | ORAL_CAPSULE | Freq: Every day | ORAL | 2 refills | Status: DC

## 2019-03-27 MED ORDER — AZELASTINE HCL 0.1 % NA SOLN: 1.0000 | Freq: Every evening | NASAL | 12 refills | Status: DC | PRN

## 2019-03-27 MED ORDER — PANTOPRAZOLE SODIUM 40 MG PO TBEC: 40.0000 mg | DELAYED_RELEASE_TABLET | Freq: Every day | ORAL | 3 refills | Status: DC

## 2019-03-27 NOTE — Telephone Encounter (Signed)
Spoke w/ Alcario Drought- informed pantoprazole to replace omeprazole.

## 2019-03-27 NOTE — Assessment & Plan Note (Signed)
HTN: BP today slightly elevated at 147/89, continue HCTZ, Micardis, check a CMP.  Monitor BPs. Hypothyroidism: Continue Synthroid, check a TSH Depression: See last visit, Effexor was switch to Lexapro, not feeling completely well on Lexapro.  PHQ-9 is elevated at 15, no suicidal ideas. On looking back, she tolerated well a low-dose of Effexor. Plan: Stop Lexapro, go back on Effexor but higher dose: XR 150 mg daily.  Call me in 3 weeks, let me know how she is doing.  Consider increased Effexor dose before the next office visit in 6 weeks Headaches: New problem.  No red flags, for completeness we will get sed rate, reassess once allergies and depression treated. Allergies:   As described above, recommend Astelin Flonase. GERD: Prilosec expensive, try pantoprazole. Preventive care: Flu shot today RTC 6 weeks

## 2019-03-27 NOTE — Progress Notes (Signed)
   Subjective:    Patient ID: Erin Long, female    DOB: Jul 07, 1955, 64 y.o.   MRN: [id number]  DOS:  03/27/2019 Type of visit - description: Follow-up We discussed today hypertension, thyroid disease, depression. She is not completely convinced Lexapro is helping, in a way she feels happier but is also lethargic and sometimes she gets "furious" which is unusual for her. No suicidal ideas  Also, a new issue: Headache, started few weeks ago, it is daily, sometimes as high as 4/10, very rarely has taken OTC for it. No fever chills.  No weight loss Admits to some sinus problems including sinus congestion and postnasal drip, she moved to a new house and wonder if symptoms are  allergy related.   Review of Systems See above   Past Medical History:  Diagnosis Date  . Hot flashes    on effexor  . Hypertension   . Hypothyroid     Past Surgical History:  Procedure Laterality Date  . FOOT SURGERY     twice  . TUBAL LIGATION Bilateral         Objective:   Physical Exam BP (!) 147/89 (BP Location: Left Arm, Patient Position: Sitting, Cuff Size: Small)   Pulse 81   Temp (!) 96.6 F (35.9 C) (Temporal)   Resp 16   Ht 5\' 7"  (1.702 m)   Wt 153 lb (69.4 kg)   SpO2 95%   BMI 23.96 kg/m  General:   Well developed, NAD, BMI noted. HEENT:  Normocephalic . Face symmetric, atraumatic Lungs:  CTA B Normal respiratory effort, no intercostal retractions, no accessory muscle use. Heart: RRR,  no murmur.  No pretibial edema bilaterally  Skin: Not pale. Not jaundice Neurologic:  alert & oriented X3.  Speech normal, gait appropriate for age and unassisted. EOMI. Temples: No TTP Psych--  Cognition and judgment appear intact.  Cooperative with normal attention span and concentration.  Behavior appropriate. No anxious or depressed appearing.      Assessment      Assessment HTN LE edema : HCTZ prn Hyperlipidemia  Hypothyroidism GERD Insomnia-- sonata per gyn as of 06-2015  Seasonal allergies  Hot flashes, on Effexor Chest pain, (-)  exercise stress test 2015 BTL  PLAN: HTN: BP today slightly elevated at 147/89, continue HCTZ, Micardis, check a CMP.  Monitor BPs. Hypothyroidism: Continue Synthroid, check a TSH Depression: See last visit, Effexor was switch to Lexapro, not feeling completely well on Lexapro.  PHQ-9 is elevated at 15, no suicidal ideas. On looking back, she tolerated well a low-dose of Effexor. Plan: Stop Lexapro, go back on Effexor but higher dose: XR 150 mg daily.  Call me in 3 weeks, let me know how she is doing.  Consider increased Effexor dose before the next office visit in 6 weeks Headaches: New problem.  No red flags, for completeness we will get sed rate, reassess once allergies and depression treated. Allergies:   As described above, recommend Astelin Flonase. GERD: Prilosec expensive, try pantoprazole. Preventive care: Flu shot today RTC 6 weeks          This visit occurred during the SARS-CoV-2 public health emergency.  Safety protocols were in place, including screening questions prior to the visit, additional usage of staff PPE, and extensive cleaning of exam room while observing appropriate contact time as indicated for disinfecting solutions.

## 2019-03-27 NOTE — Patient Instructions (Addendum)
GO TO THE LAB : Get the blood work     GO TO THE FRONT DESK Come back for a checkup in 6 weeks, please make an appointment  Lexapro: stop  Start Effexor XR 150 mg: 1 tablet daily  For allergies: Flonase 2 sprays daily Astelin, prescription sent, 2 sprays at night.  Please call me in 3 weeks let me know if the Effexor is working better for you.  If you have any unusual headache let me know otherwise we will reassess in 6 weeks. Okay to take occasional Tylenol or ibuprofen.

## 2019-03-27 NOTE — Telephone Encounter (Signed)
Patients Pharmacy called in to see if thepantoprazole (PROTONIX) 40 MG tablet [[id number]]    Order Details Is replacing the (OMEPRAZOLE) Please follow up with Ree Kida at the Pharmacy at [phone] as soon as possible   Thanks

## 2019-03-27 NOTE — Progress Notes (Signed)
Pre visit review using our clinic review tool, if applicable. No additional management support is needed unless otherwise documented below in the visit note. 

## 2019-04-12 ENCOUNTER — Other Ambulatory Visit: Payer: Self-pay | Admitting: Internal Medicine

## 2019-05-05 ENCOUNTER — Other Ambulatory Visit: Payer: Self-pay

## 2019-05-08 ENCOUNTER — Other Ambulatory Visit: Payer: Self-pay

## 2019-05-08 ENCOUNTER — Encounter: Payer: Self-pay | Admitting: Internal Medicine

## 2019-05-08 ENCOUNTER — Ambulatory Visit: Payer: Managed Care, Other (non HMO) | Admitting: Internal Medicine

## 2019-05-08 VITALS — BP 148/78 | HR 92 | Temp 95.9°F | Resp 16 | Ht 67.0 in | Wt 153.5 lb

## 2019-05-08 DIAGNOSIS — I10 Essential (primary) hypertension: Secondary | ICD-10-CM | POA: Diagnosis not present

## 2019-05-08 DIAGNOSIS — F329 Major depressive disorder, single episode, unspecified: Secondary | ICD-10-CM

## 2019-05-08 MED ORDER — VENLAFAXINE HCL ER 150 MG PO CP24: 150.0000 mg | ORAL_CAPSULE | Freq: Every day | ORAL | 2 refills | Status: DC

## 2019-05-08 MED ORDER — TELMISARTAN-HCTZ 40-12.5 MG PO TABS: 1.0000 | ORAL_TABLET | Freq: Every day | ORAL | 0 refills | Status: DC

## 2019-05-08 NOTE — Progress Notes (Signed)
Pre visit review using our clinic review tool, if applicable. No additional management support is needed unless otherwise documented below in the visit note. 

## 2019-05-08 NOTE — Assessment & Plan Note (Signed)
HTN: Currently taking telmisartan only, not using HCTZ.  BP today slightly elevated, no ambulatory BPs. Plan: Start telmisartan HCT 40/12.5 mg 1 tablet daily, monitor BPs, BMP in 3 weeks. Depression: At the last visit, was switch from Lexapro to Effexor, she feels much better.  No suicidal ideas.  Still has occasional insomnia, takes Sonata as needed.  Refill Effexor. Headaches: See last visit, sed rate was normal, today reports that this is actually not an unusual headache, is daily, nagging, never severe.  For now we agreed on observation. RTC to 3 weeks labs RTC CPX 7-20 21

## 2019-05-08 NOTE — Progress Notes (Signed)
Subjective:    Patient ID: Erin Long, female    DOB: February 19, 1955, 64 y.o.   MRN: [id number]  DOS:  05/08/2019 Type of visit - description: Follow-up Today we talk about HTN,Depression, headaches. In general the patient is much improved. Continue with headaches described as daily, mild, located at the top of the head.   Review of Systems See above   Past Medical History:  Diagnosis Date  . Hot flashes    on effexor  . Hypertension   . Hypothyroid     Past Surgical History:  Procedure Laterality Date  . FOOT SURGERY     twice  . TUBAL LIGATION Bilateral     Allergies as of 05/08/2019      Reactions   Codeine Rash      Medication List       Accurate as of May 08, 2019 11:28 AM. If you have any questions, ask your nurse or doctor.        STOP taking these medications   hydrochlorothiazide 12.5 MG capsule Commonly known as: MICROZIDE Stopped by: Willow Ora, MD   telmisartan 40 MG tablet Commonly known as: MICARDIS Stopped by: Willow Ora, MD     TAKE these medications   atorvastatin 20 MG tablet Commonly known as: LIPITOR Take 1 tablet (20 mg total) by mouth daily.   azelastine 0.1 % nasal spray Commonly known as: ASTELIN Place 1 spray into both nostrils at bedtime as needed for rhinitis or allergies. Use in each nostril as directed   levothyroxine 100 MCG tablet Commonly known as: SYNTHROID Take 1 tablet (100 mcg total) by mouth daily before breakfast.   pantoprazole 40 MG tablet Commonly known as: PROTONIX Take 1 tablet (40 mg total) by mouth daily before breakfast.   telmisartan-hydrochlorothiazide 40-12.5 MG tablet Commonly known as: MICARDIS HCT Take 1 tablet by mouth daily. Started by: Willow Ora, MD   venlafaxine XR 150 MG 24 hr capsule Commonly known as: Effexor XR Take 1 capsule (150 mg total) by mouth daily with breakfast.   zaleplon 10 MG capsule Commonly known as: SONATA Take 10 mg by mouth at bedtime as needed for sleep.           Objective:   Physical Exam BP (!) 148/78 (BP Location: Left Arm, Patient Position: Sitting, Cuff Size: Small)   Pulse 92   Temp (!) 95.9 F (35.5 C) (Temporal)   Resp 16   Ht 5\' 7"  (1.702 m)   Wt 153 lb 8 oz (69.6 kg)   SpO2 99%   BMI 24.04 kg/m  General:   Well developed, NAD, BMI noted. HEENT:  Normocephalic . Face symmetric, atraumatic  Neurologic:  alert & oriented X3.  Speech normal, gait appropriate for age and unassisted Psych--  Cognition and judgment appear intact.  Cooperative with normal attention span and concentration.  Behavior appropriate. No anxious or depressed appearing.      Assessment       Assessment HTN LE edema : HCTZ prn Hyperlipidemia  Hypothyroidism GERD Insomnia-- sonata per gyn as of 06-2015 Seasonal allergies  Hot flashes, on Effexor Chest pain, (-)  exercise stress test 2015 BTL  PLAN: HTN: Currently taking telmisartan only, not using HCTZ.  BP today slightly elevated, no ambulatory BPs. Plan: Start telmisartan HCT 40/12.5 mg 1 tablet daily, monitor BPs, BMP in 3 weeks. Depression: At the last visit, was switch from Lexapro to Effexor, she feels much better.  No suicidal ideas.  Still has occasional insomnia,  takes [employer] as needed.  Refill Effexor. Headaches: See last visit, sed rate was normal, today reports that this is actually not an unusual headache, is daily, nagging, never severe.  For now we agreed on observation. RTC to 3 weeks labs RTC CPX 7-20 21  This visit occurred during the SARS-CoV-2 public health emergency.  Safety protocols were in place, including screening questions prior to the visit, additional usage of staff PPE, and extensive cleaning of exam room while observing appropriate contact time as indicated for disinfecting solutions.

## 2019-05-08 NOTE — Patient Instructions (Addendum)
Take  telmisartan 40 mg and hydrochlorothiazide 12.5 mg every day  start checking your blood pressures 2 or 3 times a month  BP GOAL is between 110/65 and  135/85. If it is consistently higher or lower, let me know   GO TO THE FRONT DESK, please reschedule your appointments Come back for blood work in 2 or 3 weeks  Come back for for a physical exam by 08/2019

## 2019-05-09 ENCOUNTER — Telehealth: Payer: Self-pay | Admitting: Internal Medicine

## 2019-05-09 NOTE — Telephone Encounter (Signed)
Spoke w/ Wynona Canes at [facility]- telemisartan-hctz 40-12.5mg  replacing telmisartan 40mg  and Effexor 150mg  replacing 75mg .

## 2019-05-09 NOTE — Telephone Encounter (Signed)
[employer] to make sure that  telmisartan-hydrochlorothiazide (MICARDIS HCT) 40-12.5 MG tablet [[id number]  is replacing telmisartan.

## 2019-06-20 ENCOUNTER — Telehealth: Payer: Self-pay | Admitting: Internal Medicine

## 2019-06-20 NOTE — Telephone Encounter (Signed)
Caller: Jaselynn Tamas  Call Back # [phone]  Subject : Medical Records   Patient called requesting medication records for insurance purposes.

## 2019-07-11 ENCOUNTER — Other Ambulatory Visit: Payer: Self-pay | Admitting: Internal Medicine

## 2019-08-08 ENCOUNTER — Other Ambulatory Visit: Payer: Self-pay | Admitting: Internal Medicine

## 2019-08-24 ENCOUNTER — Encounter: Payer: Self-pay | Admitting: Internal Medicine

## 2019-09-07 ENCOUNTER — Encounter: Payer: Managed Care, Other (non HMO) | Admitting: Internal Medicine

## 2019-10-02 ENCOUNTER — Telehealth: Payer: Self-pay | Admitting: Internal Medicine

## 2019-10-02 MED ORDER — LEVOTHYROXINE SODIUM 100 MCG PO TABS: 100.0000 ug | ORAL_TABLET | Freq: Every day | ORAL | 0 refills | Status: DC

## 2019-10-02 MED ORDER — TELMISARTAN-HCTZ 40-12.5 MG PO TABS: 1.0000 | ORAL_TABLET | Freq: Every day | ORAL | 0 refills | Status: DC

## 2019-10-02 NOTE — Telephone Encounter (Signed)
Pt missed her CPE and has r/s for 11/03/19. Needs synthroid and micardis refilled until she can get in please.

## 2019-10-02 NOTE — Telephone Encounter (Signed)
Which pharmacy?

## 2019-10-02 NOTE — Telephone Encounter (Signed)
Rxs sent

## 2019-10-02 NOTE — Telephone Encounter (Signed)
She said [place] - sorry!

## 2019-10-09 ENCOUNTER — Other Ambulatory Visit: Payer: Self-pay | Admitting: Internal Medicine

## 2019-11-03 ENCOUNTER — Encounter: Payer: Managed Care, Other (non HMO) | Admitting: Internal Medicine

## 2019-12-07 ENCOUNTER — Encounter: Payer: Self-pay | Admitting: Internal Medicine

## 2019-12-07 ENCOUNTER — Other Ambulatory Visit: Payer: Self-pay

## 2019-12-07 ENCOUNTER — Ambulatory Visit (INDEPENDENT_AMBULATORY_CARE_PROVIDER_SITE_OTHER): Payer: Managed Care, Other (non HMO) | Admitting: Internal Medicine

## 2019-12-07 VITALS — BP 146/87 | HR 81 | Temp 98.2°F | Resp 16 | Ht 67.0 in | Wt 149.2 lb

## 2019-12-07 DIAGNOSIS — Z23 Encounter for immunization: Secondary | ICD-10-CM | POA: Diagnosis not present

## 2019-12-07 DIAGNOSIS — E785 Hyperlipidemia, unspecified: Secondary | ICD-10-CM | POA: Diagnosis not present

## 2019-12-07 DIAGNOSIS — E039 Hypothyroidism, unspecified: Secondary | ICD-10-CM

## 2019-12-07 DIAGNOSIS — Z0000 Encounter for general adult medical examination without abnormal findings: Secondary | ICD-10-CM | POA: Diagnosis not present

## 2019-12-07 DIAGNOSIS — I10 Essential (primary) hypertension: Secondary | ICD-10-CM

## 2019-12-07 DIAGNOSIS — Z09 Encounter for follow-up examination after completed treatment for conditions other than malignant neoplasm: Secondary | ICD-10-CM

## 2019-12-07 MED ORDER — PANTOPRAZOLE SODIUM 40 MG PO TBEC: 40.0000 mg | DELAYED_RELEASE_TABLET | Freq: Every day | ORAL | 3 refills | Status: DC

## 2019-12-07 NOTE — Patient Instructions (Addendum)
Happy early [place] Ouch!  Check the  blood pressure weekly  BP GOAL is between 110/65 and  135/85. If it is consistently higher or lower, let me know    GO TO THE LAB : Get the blood work     GO TO THE FRONT DESK, PLEASE SCHEDULE YOUR APPOINTMENTS Come back for  A check up in 6 months

## 2019-12-07 NOTE — Progress Notes (Signed)
Pre visit review using our clinic review tool, if applicable. No additional management support is needed unless otherwise documented below in the visit note. 

## 2019-12-07 NOTE — Progress Notes (Signed)
Subjective:    Patient ID: Erin Long, female    DOB: December 14, 1955, 64 y.o.   MRN: [id number]  DOS:  12/07/2019 Type of visit - description: CPX In general feels well but admits to stress. OTC Nexium is not controlling gerd sxs Has occasional cough.   BP Readings from Last 3 Encounters:  12/07/19 (!) 146/87  05/08/19 (!) 148/78  03/27/19 (!) 147/89    Review of Systems  Other than above, a 14 point review of systems is negative      Past Medical History:  Diagnosis Date  . Hot flashes    on effexor  . Hypertension   . Hypothyroid     Past Surgical History:  Procedure Laterality Date  . FOOT SURGERY     twice  . TUBAL LIGATION Bilateral     Allergies as of 12/07/2019      Reactions   Codeine Rash      Medication List       Accurate as of December 07, 2019 11:59 PM. If you have any questions, ask your nurse or doctor.        STOP taking these medications   NEXIUM 24HR PO Stopped by: Willow Ora, MD     TAKE these medications   atorvastatin 20 MG tablet Commonly known as: LIPITOR Take 1 tablet (20 mg total) by mouth daily.   azelastine 0.1 % nasal spray Commonly known as: ASTELIN Place 1 spray into both nostrils at bedtime as needed for rhinitis or allergies. Use in each nostril as directed   levothyroxine 100 MCG tablet Commonly known as: SYNTHROID Take 1 tablet (100 mcg total) by mouth daily before breakfast.   pantoprazole 40 MG tablet Commonly known as: PROTONIX Take 1 tablet (40 mg total) by mouth daily before breakfast.   telmisartan-hydrochlorothiazide 40-12.5 MG tablet Commonly known as: MICARDIS HCT Take 1 tablet by mouth daily.   venlafaxine XR 150 MG 24 hr capsule Commonly known as: Effexor XR Take 1 capsule (150 mg total) by mouth daily with breakfast.   zaleplon 10 MG capsule Commonly known as: SONATA Take 10 mg by mouth at bedtime as needed for sleep.          Objective:   Physical Exam BP (!) 146/87 (BP Location:  Left Arm, Patient Position: Sitting, Cuff Size: Small)   Pulse 81   Temp 98.2 F (36.8 C) (Oral)   Resp 16   Ht 5\' 7"  (1.702 m)   Wt 149 lb 4 oz (67.7 kg)   SpO2 96%   BMI 23.38 kg/m   General: Well developed, NAD, BMI noted Neck: No  thyromegaly  HEENT:  Normocephalic . Face symmetric, atraumatic Lungs:  CTA B Normal respiratory effort, no intercostal retractions, no accessory muscle use. Heart: RRR,  no murmur.  Abdomen:  Not distended, soft, non-tender. No rebound or rigidity.   Lower extremities: no pretibial edema bilaterally  Skin: Exposed areas without rash. Not pale. Not jaundice Neurologic:  alert & oriented X3.  Speech normal, gait appropriate for age and unassisted Strength symmetric and appropriate for age.  Psych: Cognition and judgment appear intact.  Cooperative with normal attention span and concentration.  Behavior appropriate. No anxious or depressed appearing.     Assessment      Assessment HTN LE edema : HCTZ prn Hyperlipidemia  Hypothyroidism GERD Insomnia-- sonata per gyn as of 06-2015 Seasonal allergies  Hot flashes, on Effexor Chest pain, (-)  exercise stress test 2015 BTL  PLAN: Here  for CPX HTN: On Micardis HCT, BPs at the office borderline elevated, no ambulatory BPs, recommend to check weekly, see AVS.  Reassess in 6 months Hyperlipidemia on Lipitor, check FLP Hypothyroidism: On Synthroid, check TSH GERD: Currently on Nexium OTC, not controlling symptoms as well as Protonix did,  it was very expensive but she would like to go back on it.  Prescription printed. Occasionally has a cough, could be related to GERD, to let me know if that is no better Insomnia, very rarely takes Sonata Stress: Related to her mother's health, she is my patient, counseled today. RTC 6 months   This visit occurred during the SARS-CoV-2 public health emergency.  Safety protocols were in place, including screening questions prior to the visit, additional  usage of staff PPE, and extensive cleaning of exam room while observing appropriate contact time as indicated for disinfecting solutions.

## 2019-12-08 ENCOUNTER — Encounter: Payer: Self-pay | Admitting: Internal Medicine

## 2019-12-08 ENCOUNTER — Other Ambulatory Visit: Payer: Self-pay | Admitting: Internal Medicine

## 2019-12-08 LAB — LIPID PANEL
Cholesterol: 218 mg/dL — ABNORMAL HIGH (ref <200)
HDL: 83 mg/dL (ref >=50)
LDL Cholesterol (Calc): 117 mg/dL (calc) — ABNORMAL HIGH
Non-HDL Cholesterol (Calc): 135 mg/dL (calc) — ABNORMAL HIGH (ref <130)
Total CHOL/HDL Ratio: 2.6 (calc) (ref <5.0)
Triglycerides: 82 mg/dL (ref <150)

## 2019-12-08 LAB — BASIC METABOLIC PANEL
BUN: 16 mg/dL (ref 7–25)
CO2: 27 mmol/L (ref 20–32)
Calcium: 10.2 mg/dL (ref 8.6–10.4)
Chloride: 102 mmol/L (ref 98–110)
Creat: 0.87 mg/dL (ref 0.50–0.99)
Glucose, Bld: 95 mg/dL (ref 65–99)
Potassium: 5.1 mmol/L (ref 3.5–5.3)
Sodium: 139 mmol/L (ref 135–146)

## 2019-12-08 LAB — CBC WITH DIFFERENTIAL/PLATELET
Absolute Monocytes: 739 cells/uL (ref 200–950)
Basophils Absolute: 22 cells/uL (ref 0–200)
Basophils Relative: 0.4 %
Eosinophils Absolute: 11 cells/uL — ABNORMAL LOW (ref 15–500)
Eosinophils Relative: 0.2 %
HCT: 43 % (ref 35.0–45.0)
Hemoglobin: 14.5 g/dL (ref 11.7–15.5)
Lymphs Abs: 1736 cells/uL (ref 850–3900)
MCH: 31.1 pg (ref 27.0–33.0)
MCHC: 33.7 g/dL (ref 32.0–36.0)
MCV: 92.3 fL (ref 80.0–100.0)
MPV: 11.8 fL (ref 7.5–12.5)
Monocytes Relative: 13.2 %
Neutro Abs: 3091 cells/uL (ref 1500–7800)
Neutrophils Relative %: 55.2 %
Platelets: 191 10*3/uL (ref 140–400)
RBC: 4.66 10*6/uL (ref 3.80–5.10)
RDW: 12.3 % (ref 11.0–15.0)
Total Lymphocyte: 31 %
WBC: 5.6 10*3/uL (ref 3.8–10.8)

## 2019-12-08 LAB — TSH: TSH: 1.84 mIU/L (ref 0.40–4.50)

## 2019-12-08 MED ORDER — VENLAFAXINE HCL ER 150 MG PO CP24
150.0000 mg | ORAL_CAPSULE | Freq: Every day | ORAL | 2 refills | Status: DC
Start: 2019-12-08 — End: 2020-10-03

## 2019-12-08 MED ORDER — LEVOTHYROXINE SODIUM 100 MCG PO TABS: 100.0000 ug | ORAL_TABLET | Freq: Every day | ORAL | 2 refills | Status: DC

## 2019-12-08 NOTE — Assessment & Plan Note (Signed)
-  Td 2016 - s/p  shingrex x 2 - s/p pfizer covid vax x 2 , booster optional but recommended -Flu shot: today - Female care : PAPs- DEXAper gyn  + FH breast cancer,s/p MMG 11/2017 @ gyn, rec to schedule  - CCS:  Cscope 2010, scope done d/t bleed, no report available for review, pt told it was (-), no polyps. cologuard neg 09/2018 -Encouraged healthy eating, she remains active. Labs: BMP, FLP, CBC, TSH

## 2019-12-08 NOTE — Assessment & Plan Note (Signed)
Here for CPX HTN: On Micardis HCT, BPs at the office borderline elevated, no ambulatory BPs, recommend to check weekly, see AVS.  Reassess in 6 months Hyperlipidemia on Lipitor, check FLP Hypothyroidism: On Synthroid, check TSH GERD: Currently on Nexium OTC, not controlling symptoms as well as Protonix did,  it was very expensive but she would like to go back on it.  Prescription printed. Occasionally has a cough, could be related to GERD, to let me know if that is no better Insomnia, very rarely takes Sonata Stress: Related to her mother's health, she is my patient, counseled today. RTC 6 months

## 2020-01-07 ENCOUNTER — Other Ambulatory Visit: Payer: Self-pay | Admitting: Internal Medicine

## 2020-04-10 ENCOUNTER — Encounter: Payer: Self-pay | Admitting: Internal Medicine

## 2020-06-06 ENCOUNTER — Ambulatory Visit: Payer: Managed Care, Other (non HMO) | Admitting: Internal Medicine

## 2020-06-07 NOTE — Progress Notes (Deleted)
Referring-Erin Paz, MD Reason for referral-chest pain and dyspnea  HPI: 65 year old female for evaluation of chest pain and dyspnea at request of Erin Oar MD.  Patient seen previously but not since November 2017.  Exercise treadmill 5/15 negative adequate.  Current Outpatient Medications  Medication Sig Dispense Refill  . atorvastatin (LIPITOR) 20 MG tablet Take 1 tablet (20 mg total) by mouth daily. 90 tablet 2  . azelastine (ASTELIN) 0.1 % nasal spray Place 1 spray into both nostrils at bedtime as needed for rhinitis or allergies. Use in each nostril as directed 30 mL 12  . levothyroxine (SYNTHROID) 100 MCG tablet Take 1 tablet (100 mcg total) by mouth daily before breakfast. 90 tablet 2  . pantoprazole (PROTONIX) 40 MG tablet Take 1 tablet (40 mg total) by mouth daily before breakfast. 90 tablet 3  . telmisartan-hydrochlorothiazide (MICARDIS HCT) 40-12.5 MG tablet Take 1 tablet by mouth daily. 90 tablet 2  . venlafaxine XR (EFFEXOR XR) 150 MG 24 hr capsule Take 1 capsule (150 mg total) by mouth daily with breakfast. 90 capsule 2  . zaleplon (SONATA) 10 MG capsule Take 10 mg by mouth at bedtime as needed for sleep.      No current facility-administered medications for this visit.    Allergies  Allergen Reactions  . Codeine Rash    Past Medical History:  Diagnosis Date  . Hot flashes    on effexor  . Hypertension   . Hypothyroid     Past Surgical History:  Procedure Laterality Date  . FOOT SURGERY     twice  . TUBAL LIGATION Bilateral     Social History   Socioeconomic History  . Marital status: Married    Spouse name: Not on file  . Number of children: 1  . Years of education: Not on file  . Highest education level: Not on file  Occupational History  . Occupation: stay at home mom  Tobacco Use  . Smoking status: Never Smoker  . Smokeless tobacco: Never Used  Substance and Sexual Activity  . Alcohol use: Yes    Comment: socially  . Drug use: No  . Sexual  activity: Not on file  Other Topics Concern  . Not on file  Social History Narrative   One child one and 3 Gchildren   Parents Mr. And Mrs.Erin Long    Social Determinants of Health   Financial Resource Strain: Not on file  Food Insecurity: Not on file  Transportation Needs: Not on file  Physical Activity: Not on file  Stress: Not on file  Social Connections: Not on file  Intimate Partner Violence: Not on file    Family History  Problem Relation Age of Onset  . Coronary artery disease Father        mid 23's  . Heart disease Mother 48  . Breast cancer Mother   . Other Mother        blood disorder  . Breast cancer Maternal Grandmother   . Breast cancer Other        aunt  . Diabetes Neg Hx   . Stroke Neg Hx   . Colon cancer Neg Hx     ROS: no fevers or chills, productive cough, hemoptysis, dysphasia, odynophagia, melena, hematochezia, dysuria, hematuria, rash, seizure activity, orthopnea, PND, pedal edema, claudication. Remaining systems are negative.  Physical Exam:   There were no vitals taken for this visit.  General:  Well developed/well nourished in NAD Skin warm/dry Patient not depressed No peripheral  clubbing Back-normal HEENT-normal/normal eyelids Neck supple/normal carotid upstroke bilaterally; no bruits; no JVD; no thyromegaly chest - CTA/ normal expansion CV - RRR/normal S1 and S2; no murmurs, rubs or gallops;  PMI nondisplaced Abdomen -NT/ND, no HSM, no mass, + bowel sounds, no bruit 2+ femoral pulses, no bruits Ext-no edema, chords, 2+ DP Neuro-grossly nonfocal  ECG - personally reviewed  A/P  1 chest pain-  2 dyspnea-  3 hypertension-blood pressure trolled.  Continue present medical regimen.  4 hyperlipidemia-continue statin.  Erin Millers, MD

## 2020-06-10 ENCOUNTER — Ambulatory Visit: Payer: Managed Care, Other (non HMO) | Admitting: Internal Medicine

## 2020-06-10 ENCOUNTER — Other Ambulatory Visit: Payer: Self-pay

## 2020-06-10 VITALS — BP 136/87 | HR 91 | Temp 97.7°F | Ht 67.0 in | Wt 149.0 lb

## 2020-06-10 DIAGNOSIS — F439 Reaction to severe stress, unspecified: Secondary | ICD-10-CM | POA: Diagnosis not present

## 2020-06-10 DIAGNOSIS — E039 Hypothyroidism, unspecified: Secondary | ICD-10-CM | POA: Diagnosis not present

## 2020-06-10 DIAGNOSIS — I10 Essential (primary) hypertension: Secondary | ICD-10-CM

## 2020-06-10 NOTE — Patient Instructions (Signed)
   GO TO THE LAB : Get the blood work     GO TO THE FRONT DESK, PLEASE SCHEDULE YOUR APPOINTMENTS Come back for a physical exam in 6 months 

## 2020-06-10 NOTE — Progress Notes (Signed)
Subjective:    Patient ID: Erin Long, female    DOB: October 17, 1955, 65 y.o.   MRN: [id number]  DOS:  06/10/2020 Type of visit - description: Follow-up  Since the last visit he is doing well and has no major concerns.   Review of Systems See above   Past Medical History:  Diagnosis Date  . Hot flashes    on effexor  . Hypertension   . Hypothyroid     Past Surgical History:  Procedure Laterality Date  . FOOT SURGERY     twice  . TUBAL LIGATION Bilateral     Allergies as of 06/10/2020      Reactions   Codeine Rash      Medication List       Accurate as of June 10, 2020 11:59 PM. If you have any questions, ask your nurse or doctor.        atorvastatin 20 MG tablet Commonly known as: LIPITOR Take 1 tablet (20 mg total) by mouth daily.   azelastine 0.1 % nasal spray Commonly known as: ASTELIN Place 1 spray into both nostrils at bedtime as needed for rhinitis or allergies. Use in each nostril as directed   levothyroxine 100 MCG tablet Commonly known as: SYNTHROID Take 1 tablet (100 mcg total) by mouth daily before breakfast.   pantoprazole 40 MG tablet Commonly known as: PROTONIX Take 1 tablet (40 mg total) by mouth daily before breakfast.   telmisartan-hydrochlorothiazide 40-12.5 MG tablet Commonly known as: MICARDIS HCT Take 1 tablet by mouth daily.   venlafaxine XR 150 MG 24 hr capsule Commonly known as: Effexor XR Take 1 capsule (150 mg total) by mouth daily with breakfast.   zaleplon 10 MG capsule Commonly known as: SONATA Take 10 mg by mouth at bedtime as needed for sleep.          Objective:   Physical Exam BP 136/87 (BP Location: Right Arm, Patient Position: Sitting, Cuff Size: Large)   Pulse 91   Temp 97.7 F (36.5 C) (Temporal)   Ht 5\' 7"  (1.702 m)   Wt 149 lb (67.6 kg)   SpO2 98%   BMI 23.34 kg/m  General:   Well developed, NAD, BMI noted. HEENT:  Normocephalic . Face symmetric, atraumatic Lungs:  CTA B Normal  respiratory effort, no intercostal retractions, no accessory muscle use. Heart: RRR,  no murmur.  Lower extremities: no pretibial edema bilaterally  Skin: Not pale. Not jaundice Neurologic:  alert & oriented X3.  Speech normal, gait appropriate for age and unassisted Psych--  Cognition and judgment appear intact.  Cooperative with normal attention span and concentration.  Behavior appropriate. No anxious or depressed appearing.      Assessment        Assessment HTN LE edema : HCTZ prn Hyperlipidemia  Hypothyroidism GERD Insomnia-- sonata per gyn as of 06-2015 Seasonal allergies  Hot flashes, on Effexor Chest pain, (-)  exercise stress test 2015 BTL  PLAN: HTN:  BP today satisfactory, last BMP okay, continue telmisartan-HCT. Hypothyroidism: Good med compliance, check TSH Insomnia:  Very rarely takes sonata Stress: Related to her mother's health, counseled about the issue, currently managing very well. Chest pain shortness of breath: At the end of the visit, the patient reports that about a month ago had a single episode of difficulty breathing and chest pain while doing yard work, no further symptoms, after the episodes her chest got sore for few days. She has an appointment to see cardiology in 4 days,  recommend to reach out if symptoms resurface. Preventive care: Benefits of fourth COVID-vaccine discussed RTC 6 months CPX   This visit occurred during the SARS-CoV-2 public health emergency.  Safety protocols were in place, including screening questions prior to the visit, additional usage of staff PPE, and extensive cleaning of exam room while observing appropriate contact time as indicated for disinfecting solutions.

## 2020-06-11 LAB — TSH: TSH: 1.41 u[IU]/mL (ref 0.35–4.50)

## 2020-06-11 NOTE — Assessment & Plan Note (Signed)
HTN:  BP today satisfactory, last BMP okay, continue telmisartan-HCT. Hypothyroidism: Good med compliance, check TSH Insomnia:  Very rarely takes sonata Stress: Related to her mother's health, counseled about the issue, currently managing very well. Chest pain shortness of breath: At the end of the visit, the patient reports that about a month ago had a single episode of difficulty breathing and chest pain while doing yard work, no further symptoms, after the episodes her chest got sore for few days. She has an appointment to see cardiology in 4 days, recommend to reach out if symptoms resurface. Preventive care: Benefits of fourth COVID-vaccine discussed RTC 6 months CPX

## 2020-06-14 ENCOUNTER — Ambulatory Visit: Payer: Managed Care, Other (non HMO) | Admitting: Cardiology

## 2020-07-02 LAB — HM MAMMOGRAPHY

## 2020-07-02 LAB — HM DEXA SCAN

## 2020-07-02 LAB — HM PAP SMEAR

## 2020-07-17 HISTORY — PX: BREAST REDUCTION SURGERY: SHX8

## 2020-08-13 NOTE — Progress Notes (Deleted)
Referring-Erin Drue Novel, MD Reason for referral-chest pain  HPI: 65 year old female for evaluation of chest pain at request of Porfirio Oar, MD.  Patient seen previously but not since November 2017. ETT 5/15 negative adequate.  Current Outpatient Medications  Medication Sig Dispense Refill   atorvastatin (LIPITOR) 20 MG tablet Take 1 tablet (20 mg total) by mouth daily. 90 tablet 2   azelastine (ASTELIN) 0.1 % nasal spray Place 1 spray into both nostrils at bedtime as needed for rhinitis or allergies. Use in each nostril as directed 30 mL 12   levothyroxine (SYNTHROID) 100 MCG tablet Take 1 tablet (100 mcg total) by mouth daily before breakfast. 90 tablet 2   pantoprazole (PROTONIX) 40 MG tablet Take 1 tablet (40 mg total) by mouth daily before breakfast. (Patient not taking: Reported on 06/10/2020) 90 tablet 3   telmisartan-hydrochlorothiazide (MICARDIS HCT) 40-12.5 MG tablet Take 1 tablet by mouth daily. 90 tablet 2   venlafaxine XR (EFFEXOR XR) 150 MG 24 hr capsule Take 1 capsule (150 mg total) by mouth daily with breakfast. 90 capsule 2   zaleplon (SONATA) 10 MG capsule Take 10 mg by mouth at bedtime as needed for sleep.      No current facility-administered medications for this visit.    Allergies  Allergen Reactions   Codeine Rash     Past Medical History:  Diagnosis Date   Hot flashes    on effexor   Hypertension    Hypothyroid     Past Surgical History:  Procedure Laterality Date   FOOT SURGERY     twice   TUBAL LIGATION Bilateral     Social History   Socioeconomic History   Marital status: Married    Spouse name: Not on file   Number of children: 1   Years of education: Not on file   Highest education level: Not on file  Occupational History   Occupation: stay at home mom  Tobacco Use   Smoking status: Never   Smokeless tobacco: Never  Substance and Sexual Activity   Alcohol use: Yes    Comment: socially   Drug use: No   Sexual activity: Not on file   Other Topics Concern   Not on file  Social History Narrative   One child one and 3 Gchildren   Parents Mr. And Mrs.Wynona Neat    Social Determinants of Health   Financial Resource Strain: Not on file  Food Insecurity: Not on file  Transportation Needs: Not on file  Physical Activity: Not on file  Stress: Not on file  Social Connections: Not on file  Intimate Partner Violence: Not on file    Family History  Problem Relation Age of Onset   Coronary artery disease Father        mid 46's   Heart disease Mother 42   Breast cancer Mother    Other Mother        blood disorder   Breast cancer Maternal Grandmother    Breast cancer Other        aunt   Diabetes Neg Hx    Stroke Neg Hx    Colon cancer Neg Hx     ROS: no fevers or chills, productive cough, hemoptysis, dysphasia, odynophagia, melena, hematochezia, dysuria, hematuria, rash, seizure activity, orthopnea, PND, pedal edema, claudication. Remaining systems are negative.  Physical Exam:   There were no vitals taken for this visit.  General:  Well developed/well nourished in NAD Skin warm/dry Patient not depressed No peripheral clubbing  Back-normal HEENT-normal/normal eyelids Neck supple/normal carotid upstroke bilaterally; no bruits; no JVD; no thyromegaly chest - CTA/ normal expansion CV - RRR/normal S1 and S2; no murmurs, rubs or gallops;  PMI nondisplaced Abdomen -NT/ND, no HSM, no mass, + bowel sounds, no bruit 2+ femoral pulses, no bruits Ext-no edema, chords, 2+ DP Neuro-grossly nonfocal  ECG - personally reviewed  A/P  1 chest pain-  2 hypertension-patient's blood pressure is controlled.  Continue present medications and follow.  3 hyperlipidemia-continue statin.  Olga Millers, MD

## 2020-08-16 HISTORY — PX: BREAST REDUCTION SURGERY: SHX8

## 2020-08-26 ENCOUNTER — Ambulatory Visit: Payer: Managed Care, Other (non HMO) | Admitting: Cardiology

## 2020-09-08 ENCOUNTER — Other Ambulatory Visit: Payer: Self-pay | Admitting: Internal Medicine

## 2020-10-03 ENCOUNTER — Other Ambulatory Visit: Payer: Self-pay | Admitting: Internal Medicine

## 2020-10-08 ENCOUNTER — Encounter: Payer: Self-pay | Admitting: Internal Medicine

## 2020-10-11 ENCOUNTER — Telehealth (INDEPENDENT_AMBULATORY_CARE_PROVIDER_SITE_OTHER): Payer: Managed Care, Other (non HMO) | Admitting: Internal Medicine

## 2020-10-11 ENCOUNTER — Other Ambulatory Visit: Payer: Self-pay

## 2020-10-11 ENCOUNTER — Encounter: Payer: Self-pay | Admitting: Internal Medicine

## 2020-10-11 VITALS — Wt 147.0 lb

## 2020-10-11 DIAGNOSIS — U071 COVID-19: Secondary | ICD-10-CM

## 2020-10-11 MED ORDER — BENZONATATE 200 MG PO CAPS: 200.0000 mg | ORAL_CAPSULE | Freq: Three times a day (TID) | ORAL | 0 refills | Status: DC | PRN

## 2020-10-11 NOTE — Progress Notes (Signed)
Subjective:    Patient ID: Erin Long, female    DOB: 01/07/1956, 65 y.o.   MRN: [id number]  DOS:  10/11/2020 Type of visit - description: Virtual Visit via Video Note  I connected with the above patient  by a video enabled telemedicine application and verified that I am speaking with the correct person using two identifiers.   THIS ENCOUNTER IS A VIRTUAL VISIT DUE TO COVID-19 - PATIENT WAS NOT SEEN IN THE OFFICE. PATIENT HAS CONSENTED TO VIRTUAL VISIT / TELEMEDICINE VISIT   Location of patient: home  Location of provider: office  Persons participating in the virtual visit: patient, provider   I discussed the limitations of evaluation and management by telemedicine and the availability of in person appointments. The patient expressed understanding and agreed to proceed.   Acute Symptoms started approximately 5 to perhaps 6 days ago. + Sinus congestion, initially had fever up to 101.0 along with myalgias. The first 2 days were difficult but since then she is steadily improving. Today she feels essentially okay except for a persistent cough that sometimes is hard to stop. Sputum has been minimal.  Denies nausea or vomiting except when she has severe cough. No chest pain or difficulty breathing.  No dyspnea on exertion.  Review of Systems See above   Past Medical History:  Diagnosis Date   Hot flashes    on effexor   Hypertension    Hypothyroid     Past Surgical History:  Procedure Laterality Date   FOOT SURGERY     twice   TUBAL LIGATION Bilateral     Allergies as of 10/11/2020       Reactions   Codeine Rash        Medication List        Accurate as of October 11, 2020  1:28 PM. If you have any questions, ask your nurse or doctor.          atorvastatin 20 MG tablet Commonly known as: LIPITOR Take 1 tablet by mouth daily.   azelastine 0.1 % nasal spray Commonly known as: ASTELIN Place 1 spray into both nostrils at bedtime as needed for rhinitis  or allergies. Use in each nostril as directed   levothyroxine 100 MCG tablet Commonly known as: SYNTHROID Take 1 tablet (100 mcg total) by mouth daily before breakfast.   pantoprazole 40 MG tablet Commonly known as: PROTONIX Take 1 tablet (40 mg total) by mouth daily before breakfast.   telmisartan-hydrochlorothiazide 40-12.5 MG tablet Commonly known as: MICARDIS HCT Take 1 tablet by mouth daily.   venlafaxine XR 150 MG 24 hr capsule Commonly known as: EFFEXOR-XR Take 1 capsule by mouth daily with breakfast.   zaleplon 10 MG capsule Commonly known as: SONATA Take 10 mg by mouth at bedtime as needed for sleep.           Objective:   Physical Exam Wt 147 lb (66.7 kg)   BMI 23.02 kg/m  This is a virtual video visit, she looks well, alert oriented x3, in no distress.  I did cough a couple of times, mild large airway congestion noted. No vital signs or O2 sat available.     Assessment      Assessment HTN LE edema : HCTZ prn Hyperlipidemia  Hypothyroidism GERD Insomnia-- sonata per gyn as of 06-2015 Seasonal allergies  Hot flashes, on Effexor Chest pain, (-)  exercise stress test 2015 BTL  PLAN: COVID-19: Symptoms a started 5 (perhaps 6) days ago.  Worst  days were the first two. Remaining symptoms are persisting cough and some "teeth/gum pain". We had a long conversation about medications, she is at the borderline window of opportunity to be treated w/ antivirals. Risk of getting worse is definitely low but could be near 0 with medications. After long conversation we agree on no antivirals. Plan:  Rest, fluids, Mucinex DM, Tessalon Perles.  Call if not gradually better. Recommend isolation for at least 7 if not 10 days. If she has a chance to check O2 sats, needs to seek medical attention in if less than 94%.    I discussed the assessment and treatment plan with the patient. The patient was provided an opportunity to ask questions and all were answered. The  patient agreed with the plan and demonstrated an understanding of the instructions.   The patient was advised to call back or seek an in-person evaluation if the symptoms worsen or if the condition fails to improve as anticipated.

## 2020-10-12 NOTE — Assessment & Plan Note (Signed)
COVID-19: Symptoms a started 5 (perhaps 6) days ago.  Worst days were the first two. Remaining symptoms are persisting cough and some "teeth/gum pain". We had a long conversation about medications, she is at the borderline window of opportunity to be treated w/ antivirals. Risk of getting worse is definitely low but could be near 0 with medications. After long conversation we agree on no antivirals. Plan:  Rest, fluids, Mucinex DM, Tessalon Perles.  Call if not gradually better. Recommend isolation for at least 7 if not 10 days. If she has a chance to check O2 sats, needs to seek medical attention in if less than 94%.

## 2020-12-09 LAB — HM MAMMOGRAPHY

## 2020-12-10 ENCOUNTER — Encounter: Payer: Managed Care, Other (non HMO) | Admitting: Internal Medicine

## 2020-12-31 ENCOUNTER — Other Ambulatory Visit: Payer: Self-pay

## 2020-12-31 ENCOUNTER — Ambulatory Visit (INDEPENDENT_AMBULATORY_CARE_PROVIDER_SITE_OTHER): Payer: Managed Care, Other (non HMO) | Admitting: Internal Medicine

## 2020-12-31 ENCOUNTER — Encounter: Payer: Self-pay | Admitting: Internal Medicine

## 2020-12-31 VITALS — BP 112/78 | HR 84 | Temp 100.3°F | Resp 16 | Wt 144.6 lb

## 2020-12-31 DIAGNOSIS — E559 Vitamin D deficiency, unspecified: Secondary | ICD-10-CM

## 2020-12-31 DIAGNOSIS — E785 Hyperlipidemia, unspecified: Secondary | ICD-10-CM | POA: Diagnosis not present

## 2020-12-31 DIAGNOSIS — Z23 Encounter for immunization: Secondary | ICD-10-CM

## 2020-12-31 DIAGNOSIS — Z0000 Encounter for general adult medical examination without abnormal findings: Secondary | ICD-10-CM

## 2020-12-31 LAB — LIPID PANEL
Cholesterol: 196 mg/dL (ref 0–200)
HDL: 83.1 mg/dL (ref >39.00)
LDL Cholesterol: 90 mg/dL (ref 0–99)
NonHDL: 113.36
Total CHOL/HDL Ratio: 2
Triglycerides: 119 mg/dL (ref 0.0–149.0)
VLDL: 23.8 mg/dL (ref 0.0–40.0)

## 2020-12-31 LAB — CBC WITH DIFFERENTIAL/PLATELET
Basophils Absolute: 0 10*3/uL (ref 0.0–0.1)
Basophils Relative: 0.4 % (ref 0.0–3.0)
Eosinophils Absolute: 0 10*3/uL (ref 0.0–0.7)
Eosinophils Relative: 0.3 % (ref 0.0–5.0)
HCT: 40.6 % (ref 36.0–46.0)
Hemoglobin: 13.5 g/dL (ref 12.0–15.0)
Lymphocytes Relative: 31.5 % (ref 12.0–46.0)
Lymphs Abs: 1.8 10*3/uL (ref 0.7–4.0)
MCHC: 33.1 g/dL (ref 30.0–36.0)
MCV: 92.6 fl (ref 78.0–100.0)
Monocytes Absolute: 0.8 10*3/uL (ref 0.1–1.0)
Monocytes Relative: 13.7 % — ABNORMAL HIGH (ref 3.0–12.0)
Neutro Abs: 3.2 10*3/uL (ref 1.4–7.7)
Neutrophils Relative %: 54.1 % (ref 43.0–77.0)
Platelets: 186 10*3/uL (ref 150.0–400.0)
RBC: 4.39 Mil/uL (ref 3.87–5.11)
RDW: 13 % (ref 11.5–15.5)
WBC: 5.8 10*3/uL (ref 4.0–10.5)

## 2020-12-31 LAB — COMPREHENSIVE METABOLIC PANEL
ALT: 12 U/L (ref 0–35)
AST: 15 U/L (ref 0–37)
Albumin: 4.4 g/dL (ref 3.5–5.2)
Alkaline Phosphatase: 95 U/L (ref 39–117)
BUN: 16 mg/dL (ref 6–23)
CO2: 32 mEq/L (ref 19–32)
Calcium: 9.7 mg/dL (ref 8.4–10.5)
Chloride: 100 mEq/L (ref 96–112)
Creatinine, Ser: 0.74 mg/dL (ref 0.40–1.20)
GFR: 86.32 mL/min (ref >60.00)
Glucose, Bld: 92 mg/dL (ref 70–99)
Potassium: 4.4 mEq/L (ref 3.5–5.1)
Sodium: 138 mEq/L (ref 135–145)
Total Bilirubin: 0.6 mg/dL (ref 0.2–1.2)
Total Protein: 6.8 g/dL (ref 6.0–8.3)

## 2020-12-31 LAB — TSH: TSH: 1.96 u[IU]/mL (ref 0.35–5.50)

## 2020-12-31 LAB — VITAMIN D 25 HYDROXY (VIT D DEFICIENCY, FRACTURES): VITD: 34.7 ng/mL (ref 30.00–100.00)

## 2020-12-31 NOTE — Assessment & Plan Note (Signed)
-  Td 2016 - s/p  shingrex x 2 -  covid vax: rec bivalent, declined  -Flu shot: today - Female care : PAPs- DEXA per gyn   + FH breast cancer,  s/p MMG 06/2020 ([facility])  - CCS:   Cscope 2010, scope done d/t bleed, no report available for review, pt told it was (-), no polyps . Cologuard neg 09/2018 -Diet and exercise: Counseled.. -Labs: CMP FLP CBC TSH vitamin D - POA d/w pt

## 2020-12-31 NOTE — Assessment & Plan Note (Signed)
Assessment HTN Stress/anxiety   LE edema : HCTZ prn Hyperlipidemia  Hypothyroidism GERD Insomnia-- sonata per gyn as of 06-2015 Seasonal allergies  Hot flashes, on Effexor (for hot flashes and anxiety) Chest pain, (-)  exercise stress test 2015 BTL COVID infection 09/2020    PLAN: Here for CPX HTN: On Micardis HCT, no change.  Monitor BPs Anxiety: Controlled, on Effexor. Hyperlipidemia: On Lipitor.  Labs Hypothyroidism: On meds, check TSH Chest pain, DOE: Had sxs during the summer, saw cardiology, then referred to GI, visit pending.  No further symptoms. Vitamin D deficiency: h/o,  taking OTCs, dose?  Rx 1000 to 2000 units daily.  Checking levels RTC 1 year

## 2020-12-31 NOTE — Patient Instructions (Addendum)
Check the  blood pressure 2 or 3 times a month  BP GOAL is between 110/65 and  135/85. If it is consistently higher or lower, let me know  Vitamin D : ~ 1,000 to 2,000 units a day   GO TO THE LAB : Get the blood work     GO TO THE FRONT DESK, PLEASE SCHEDULE YOUR APPOINTMENTS Come back for  a physical in 1 year     "Living will", "Health Care Power of attorney": Advanced care planning  (If you already have a living will or healthcare power of attorney, please bring the copy to be scanned in your chart.)  Advance care planning is a process that supports adults in  understanding and sharing their preferences regarding future medical care.   The patient's preferences are recorded in documents called Advance Directives.    Advanced directives are completed (and can be modified at any time) while the patient is in full mental capacity.   The documentation should be available at all times to the patient, the family and the healthcare providers.  Bring in a copy to be scanned in your chart is an excellent idea and is recommended   This legal documents direct treatment decision making and/or appoint a surrogate to make the decision if the patient is not capable to do so.    Advance directives can be documented in many types of formats,  documents have names such as:  Lliving will  Durable power of attorney for healthcare (healthcare proxy or healthcare power of attorney)  Combined directives  Physician orders for life-sustaining treatment    More information at:  [email or web address]

## 2020-12-31 NOTE — Progress Notes (Signed)
Subjective:    Patient ID: Erin Long, female    DOB: [birth year], 65 y.o.   MRN: [id number]  DOS:  12/31/2020 Type of visit - description: cpx  In general feeling well. During the summer did have a episode of chest pain and  difficulty breathing.  Saw cardiology and   will see GI soon. No records  Review of Systems  Other than above, a 14 point review of systems is negative     Past Medical History:  Diagnosis Date   Hot flashes    on effexor   Hypertension    Hypothyroid     Past Surgical History:  Procedure Laterality Date   BREAST REDUCTION SURGERY  07/2020   FOOT SURGERY     twice   TUBAL LIGATION Bilateral    Social History   Socioeconomic History   Marital status: Married    Spouse name: Not on file   Number of children: 1   Years of education: Not on file   Highest education level: Not on file  Occupational History   Occupation: stay at home mom  Tobacco Use   Smoking status: Never   Smokeless tobacco: Never  Substance and Sexual Activity   Alcohol use: Yes    Comment: socially   Drug use: No   Sexual activity: Not on file  Other Topics Concern   Not on file  Social History Narrative   One child one and 3 Gchildren   Parents Mr. And Mrs.Wynona Neat    Social Determinants of Health   Financial Resource Strain: Not on file  Food Insecurity: Not on file  Transportation Needs: Not on file  Physical Activity: Not on file  Stress: Not on file  Social Connections: Not on file  Intimate Partner Violence: Not on file    Allergies as of 12/31/2020   No Active Allergies      Medication List        Accurate as of December 31, 2020  8:31 PM. If you have any questions, ask your nurse or doctor.          STOP taking these medications    azelastine 0.1 % nasal spray Commonly known as: ASTELIN Stopped by: Willow Ora, MD   benzonatate 200 MG capsule Commonly known as: TESSALON Stopped by: Willow Ora, MD       TAKE these medications     atorvastatin 20 MG tablet Commonly known as: LIPITOR Take 1 tablet by mouth daily.   levothyroxine 100 MCG tablet Commonly known as: SYNTHROID Take 1 tablet (100 mcg total) by mouth daily before breakfast.   pantoprazole 40 MG tablet Commonly known as: PROTONIX Take 1 tablet (40 mg total) by mouth daily before breakfast.   telmisartan-hydrochlorothiazide 40-12.5 MG tablet Commonly known as: MICARDIS HCT Take 1 tablet by mouth daily.   venlafaxine XR 150 MG 24 hr capsule Commonly known as: EFFEXOR-XR Take 1 capsule by mouth daily with breakfast.   zaleplon 10 MG capsule Commonly known as: SONATA Take 10 mg by mouth at bedtime as needed for sleep.           Objective:   Physical Exam BP 112/78   Pulse 84   Temp 100.3 F (37.9 C)   Resp 16   Wt 144 lb 9.6 oz (65.6 kg)   SpO2 98%   BMI 22.65 kg/m  General: Well developed, NAD, BMI noted Neck: No  thyromegaly  HEENT:  Normocephalic . Face symmetric, atraumatic Lungs:  CTA B  Normal respiratory effort, no intercostal retractions, no accessory muscle use. Heart: RRR,  no murmur.  Abdomen:  Not distended, soft, non-tender. No rebound or rigidity.   Lower extremities: no pretibial edema bilaterally  Skin: Exposed areas without rash. Not pale. Not jaundice Neurologic:  alert & oriented X3.  Speech normal, gait appropriate for age and unassisted Strength symmetric and appropriate for age.  Psych: Cognition and judgment appear intact.  Cooperative with normal attention span and concentration.  Behavior appropriate. No anxious or depressed appearing.     Assessment     Assessment HTN Stress/anxiety   LE edema : HCTZ prn Hyperlipidemia  Hypothyroidism GERD Insomnia-- sonata per gyn as of 06-2015 Seasonal allergies  Hot flashes, on Effexor (for hot flashes and anxiety) Chest pain, (-)  exercise stress test 2015 BTL COVID infection 09/2020    PLAN: Here for CPX HTN: On Micardis HCT, no change.   Monitor BPs Anxiety: Controlled, on Effexor. Hyperlipidemia: On Lipitor.  Labs Hypothyroidism: On meds, check TSH Chest pain, DOE: Had sxs during the summer, saw cardiology, then referred to GI, visit pending.  No further symptoms. Vitamin D deficiency: h/o,  taking OTCs, dose?  Rx 1000 to 2000 units daily.  Checking levels RTC 1 year   This visit occurred during the SARS-CoV-2 public health emergency.  Safety protocols were in place, including screening questions prior to the visit, additional usage of staff PPE, and extensive cleaning of exam room while observing appropriate contact time as indicated for disinfecting solutions.

## 2021-02-16 HISTORY — PX: BREAST SURGERY: SHX581

## 2021-03-03 ENCOUNTER — Other Ambulatory Visit: Payer: Self-pay | Admitting: Internal Medicine

## 2021-04-02 ENCOUNTER — Other Ambulatory Visit: Payer: Self-pay | Admitting: Internal Medicine

## 2021-04-03 ENCOUNTER — Ambulatory Visit: Payer: Managed Care, Other (non HMO) | Admitting: Internal Medicine

## 2021-04-03 ENCOUNTER — Encounter: Payer: Self-pay | Admitting: Internal Medicine

## 2021-04-03 ENCOUNTER — Other Ambulatory Visit: Payer: Self-pay

## 2021-04-03 ENCOUNTER — Ambulatory Visit (HOSPITAL_BASED_OUTPATIENT_CLINIC_OR_DEPARTMENT_OTHER)
Admission: RE | Admit: 2021-04-03 | Discharge: 2021-04-03 | Disposition: A | Discharge status: Home / Self Care | Payer: Managed Care, Other (non HMO) | Source: Ambulatory Visit | Attending: Internal Medicine | Admitting: Internal Medicine

## 2021-04-03 VITALS — BP 142/72 | HR 85 | Temp 98.3°F | Resp 16 | Ht 67.0 in | Wt 144.1 lb

## 2021-04-03 DIAGNOSIS — S62661A Nondisplaced fracture of distal phalanx of left index finger, initial encounter for closed fracture: Secondary | ICD-10-CM

## 2021-04-03 DIAGNOSIS — R683 Clubbing of fingers: Secondary | ICD-10-CM

## 2021-04-03 NOTE — Assessment & Plan Note (Signed)
Clubbing: Single digit clubbing, L index.  Patient is quite concerned about it, she review the literature and potential culprits are serious:   lung cancer, institutional fibrosis, lung abscess, CHF, endocarditis, etc. etc. Review of systems and recent labs are normal. She did have breast reduction revision 3 weeks ago and she still has a drain at the L breast. No FH of lung cancer, sister died from pancreatic cancer.Erin Long never used tobacco.  We agreed on the following: Chest x-ray, left index x-ray.  Observation. Come back in 3 months. We could do lung cancer screening as well which will rule out a number potential culprits RTC 72-month

## 2021-04-03 NOTE — Progress Notes (Signed)
Subjective:    Patient ID: Erin Long, female    DOB: 1955-09-23, 66 y.o.   MRN: [id number]  DOS:  04/03/2021 Type of visit - description: Acute  Few weeks ago, she noted clubbing of the L index. Denies any pain or injury. She is extremely concerned about it.  Denies fever chills No chest pain no difficulty breathing No nausea or vomiting. Currently on antibiotics for several weeks due to recent breast surgery Mild weight loss noted. No cough or sputum production  Wt Readings from Last 3 Encounters:  04/03/21 144 lb 2 oz (65.4 kg)  12/31/20 144 lb 9.6 oz (65.6 kg)  10/11/20 147 lb (66.7 kg)     Review of Systems See above   Past Medical History:  Diagnosis Date   Hot flashes    on effexor   Hypertension    Hypothyroid     Past Surgical History:  Procedure Laterality Date   BREAST REDUCTION SURGERY  08/2020   BREAST SURGERY  02/2021   revision   FOOT SURGERY     twice   TUBAL LIGATION Bilateral     Current Outpatient Medications  Medication Instructions   atorvastatin (LIPITOR) 20 MG tablet Take 1 tablet by mouth daily.   Cholecalciferol (VITAMIN D-3) 25 MCG (1000 UT) CAPS Oral   clindamycin (CLEOCIN) 300 mg, Oral, 3 times daily   doxycycline (VIBRA-TABS) 100 mg, Oral, 2 times daily   levothyroxine (SYNTHROID) 100 mcg, Oral, Daily before breakfast   omeprazole (PRILOSEC) 20 mg, Oral, Daily   pantoprazole (PROTONIX) 40 mg, Oral, Daily before breakfast   telmisartan-hydrochlorothiazide (MICARDIS HCT) 40-12.5 MG tablet 1 tablet, Oral, Daily   venlafaxine XR (EFFEXOR-XR) 150 MG 24 hr capsule Take 1 capsule by mouth daily with breakfast.   zaleplon (SONATA) 10 mg, At bedtime PRN       Objective:   Physical Exam BP (!) 142/72 (BP Location: Right Arm, Patient Position: Sitting, Cuff Size: Small)    Pulse 85    Temp 98.3 F (36.8 C) (Oral)    Resp 16    Ht 5\' 7"  (1.702 m)    Wt 144 lb 2 oz (65.4 kg)    SpO2 98%    BMI 22.57 kg/m  General:   Well  developed, NAD, BMI noted. HEENT:  Normocephalic . Face symmetric, atraumatic. Neck: No lymphadenopathies Lungs:  CTA B Normal respiratory effort, no intercostal retractions, no accessory muscle use. Heart: RRR,  no murmur.  Hands: Normal except for the left index, some clubbing (DIP with bony changes, no synovitis) Skin: Not pale. Not jaundice Neurologic:  alert & oriented X3.  Speech normal, gait appropriate for age and unassisted Psych--  Cognition and judgment appear intact.  Cooperative with normal attention span and concentration.  Behavior appropriate. No anxious or depressed appearing.      Assessment     Assessment HTN Stress/anxiety   LE edema : HCTZ prn Hyperlipidemia  Hypothyroidism GERD Insomnia-- sonata per gyn as of 06-2015 Seasonal allergies  Hot flashes, on Effexor (for hot flashes and anxiety) Chest pain, (-)  exercise stress test 2015 BTL COVID infection 09/2020   PLAN: Clubbing: Single digit clubbing, L index.  Patient is quite concerned about it, she review the literature and potential culprits are serious:   lung cancer, institutional fibrosis, lung abscess, CHF, endocarditis, etc. etc. Review of systems and recent labs are normal. She did have breast reduction revision 3 weeks ago and she still has a drain at the L  breast. No FH of lung cancer, sister died from pancreatic cancer.Chauntay never used tobacco.  We agreed on the following: Chest x-ray, left index x-ray.  Observation. Come back in 3 months. We could do lung cancer screening as well which will rule out a number potential culprits RTC 22-month    This visit occurred during the SARS-CoV-2 public health emergency.  Safety protocols were in place, including screening questions prior to the visit, additional usage of staff PPE, and extensive cleaning of exam room while observing appropriate contact time as indicated for disinfecting solutions.

## 2021-04-03 NOTE — Patient Instructions (Signed)
Proceed with x-rays downstairs  GO TO THE FRONT DESK, PLEASE SCHEDULE YOUR APPOINTMENTS Come back for a checkup in 3 months

## 2021-04-04 NOTE — Addendum Note (Signed)
Addended byConrad Henning D on: 04/04/2021 10:35 AM   Modules accepted: Orders

## 2021-07-01 ENCOUNTER — Encounter: Payer: Self-pay | Admitting: Internal Medicine

## 2021-07-01 ENCOUNTER — Telehealth (INDEPENDENT_AMBULATORY_CARE_PROVIDER_SITE_OTHER): Payer: Managed Care, Other (non HMO) | Admitting: Internal Medicine

## 2021-07-01 VITALS — Ht 67.0 in | Wt 144.0 lb

## 2021-07-01 DIAGNOSIS — R683 Clubbing of fingers: Secondary | ICD-10-CM

## 2021-07-01 DIAGNOSIS — R197 Diarrhea, unspecified: Secondary | ICD-10-CM

## 2021-07-01 NOTE — Progress Notes (Signed)
? ?Subjective:  ? ? Patient ID: Erin Long, female    DOB: 1955/10/25, 66 y.o.   MRN: [id number] ? ?DOS:  07/01/2021 ?Type of visit - description: Virtual Visit via Video Note ? ?I connected with the above patient  by a video enabled telemedicine application and verified that I am speaking with the correct person using two identifiers. ?  ?Location of patient: home ? ?Location of provider: office ? ?Persons participating in the virtual visit: patient, provider  ? ?I discussed the limitations of evaluation and management by telemedicine and the availability of in person appointments. The patient expressed understanding and agreed to proceed. ? ?Follow-up ?Visit is scheduled for a finger clubbing follow-up. ?Since the last visit x-rays were done and she was found to have a fracture. ? ?We also talk about diarrhea, it is started yesterday, she thinks is food related.  Had a couple episodes yesterday and today. ?Denies fever chills.  No blood in the stools.  No nausea or vomiting. ?Her husband also had some diarrhea yesterday.  He is better today. ? ?Anxiety: Currently controlled ? ?Review of Systems ?See above  ? ?Past Medical History:  ?Diagnosis Date  ? Hot flashes   ? on effexor  ? Hypertension   ? Hypothyroid   ? ? ?Past Surgical History:  ?Procedure Laterality Date  ? BREAST REDUCTION SURGERY  08/2020  ? BREAST SURGERY  02/2021  ? revision  ? FOOT SURGERY    ? twice  ? TUBAL LIGATION Bilateral   ? ? ?Current Outpatient Medications  ?Medication Instructions  ? atorvastatin (LIPITOR) 20 MG tablet Take 1 tablet by mouth daily.  ? Cholecalciferol (VITAMIN D-3) 25 MCG (1000 UT) CAPS Oral  ? clindamycin (CLEOCIN) 300 mg, 3 times daily  ? doxycycline (VIBRA-TABS) 100 mg, 2 times daily  ? levothyroxine (SYNTHROID) 100 mcg, Oral, Daily before breakfast  ? omeprazole (PRILOSEC) 20 mg, Oral, Daily  ? pantoprazole (PROTONIX) 40 mg, Oral, Daily before breakfast  ? telmisartan-hydrochlorothiazide (MICARDIS HCT) 40-12.5 MG  tablet 1 tablet, Oral, Daily  ? venlafaxine XR (EFFEXOR-XR) 150 MG 24 hr capsule Take 1 capsule by mouth daily with breakfast.  ? zaleplon (SONATA) 10 mg, At bedtime PRN  ? ? ?   ?Objective:  ? Physical Exam ?Ht 5\' 7"  (1.702 m)   Wt 144 lb (65.3 kg)   BMI 22.55 kg/m?  ?This is a virtual video visit, alert oriented x3, in no apparent distress. ?   ?Assessment   ? ? Assessment ?HTN ?Stress/anxiety  ? LE edema : HCTZ prn ?Hyperlipidemia  ?Hypothyroidism ?GERD ?Insomnia-- sonata per gyn as of 06-2015 ?Seasonal allergies  ?Hot flashes, on Effexor (for hot flashes and anxiety) ?Chest pain, (-)  exercise stress test 2015 ?BTL ?COVID infection 09/2020 ? ? ?PLAN: ?Clubbing, finger fracture: see LOV, X-rays show finger fracture.  No need for further eval. regards clubbing. ?Diarrhea: As described above, no alarming symptoms, suspect will be a self-limited, possibly food related issue.  Recommend good hydration and let me know if not better. ?Anxiety, related to her mother's health, currently on Effexor and she feels well. ?Labs reviewed, up-to-date on all blood work. ?RTC for CPX already scheduled for November ? ?  ?I discussed the assessment and treatment plan with the patient. The patient was provided an opportunity to ask questions and all were answered. The patient agreed with the plan and demonstrated an understanding of the instructions. ?  ?The patient was advised to call back or seek an in-person  evaluation if the symptoms worsen or if the condition fails to improve as anticipated. ? ?  ?

## 2021-07-02 NOTE — Assessment & Plan Note (Signed)
Clubbing, finger fracture: see LOV, X-rays show finger fracture.  No need for further eval. regards clubbing. ?Diarrhea: As described above, no alarming symptoms, suspect will be a self-limited, possibly food related issue.  Recommend good hydration and let me know if not better. ?Anxiety, related to her mother's health, currently on Effexor and she feels well. ?Labs reviewed, up-to-date on all blood work. ?RTC for CPX already scheduled for November ?

## 2021-10-03 ENCOUNTER — Other Ambulatory Visit: Payer: Self-pay | Admitting: Internal Medicine

## 2021-10-10 ENCOUNTER — Encounter: Payer: Self-pay | Admitting: Internal Medicine

## 2021-10-10 DIAGNOSIS — Z1211 Encounter for screening for malignant neoplasm of colon: Secondary | ICD-10-CM

## 2021-10-24 ENCOUNTER — Encounter: Payer: Self-pay | Admitting: Internal Medicine

## 2021-10-31 NOTE — Telephone Encounter (Signed)
Patient called stating she would like the colorguard ordered.

## 2021-10-31 NOTE — Telephone Encounter (Signed)
Cologuard ordered

## 2021-11-13 ENCOUNTER — Other Ambulatory Visit: Payer: Managed Care, Other (non HMO)

## 2021-11-13 ENCOUNTER — Other Ambulatory Visit: Payer: Self-pay | Admitting: Plastic Surgery

## 2021-11-13 DIAGNOSIS — N61 Mastitis without abscess: Secondary | ICD-10-CM

## 2021-11-14 ENCOUNTER — Ambulatory Visit
Admission: RE | Admit: 2021-11-14 | Discharge: 2021-11-14 | Disposition: A | Discharge status: Home / Self Care | Payer: Managed Care, Other (non HMO) | Source: Ambulatory Visit | Attending: Plastic Surgery | Admitting: Plastic Surgery

## 2021-11-14 DIAGNOSIS — N61 Mastitis without abscess: Secondary | ICD-10-CM

## 2021-12-04 LAB — COLOGUARD: COLOGUARD: NEGATIVE

## 2021-12-05 ENCOUNTER — Telehealth: Payer: Self-pay | Admitting: Internal Medicine

## 2021-12-05 NOTE — Telephone Encounter (Signed)
Lvm2 call back.

## 2021-12-05 NOTE — Telephone Encounter (Signed)
Will need an appt w/ Paz first. We can wait until her visit next month if she prefers.

## 2021-12-05 NOTE — Telephone Encounter (Signed)
Patient would like a referral to a therapist  Please advise

## 2021-12-09 ENCOUNTER — Encounter: Payer: Self-pay | Admitting: Internal Medicine

## 2021-12-09 ENCOUNTER — Ambulatory Visit: Payer: Managed Care, Other (non HMO) | Admitting: Internal Medicine

## 2021-12-09 VITALS — BP 136/84 | HR 94 | Temp 98.3°F | Resp 16 | Ht 67.0 in | Wt 146.2 lb

## 2021-12-09 DIAGNOSIS — F418 Other specified anxiety disorders: Secondary | ICD-10-CM | POA: Diagnosis not present

## 2021-12-09 DIAGNOSIS — Z23 Encounter for immunization: Secondary | ICD-10-CM

## 2021-12-09 NOTE — Progress Notes (Unsigned)
   Subjective:    Patient ID: Erin Long, female    DOB: 1955/10/15, 66 y.o.   MRN: [id number]  DOS:  12/09/2021 Type of visit - description: Acute  The patient is not feeling well emotionally. She has a lot on her plate: Her mother has chronic medical conditions, she is currently at a rehab facility. She lost a dear friend She lost a couple of other family members.  Feels anxious more than depressed and oftentimes overwhelmed. Has chronic insomnia, at baseline No suicidal ideas.  Also, has discharge (?)  from the ears.  No pain, no bloody discharge, no purulent discharge, no HOH  Review of Systems See above   Past Medical History:  Diagnosis Date   Hot flashes    on effexor   Hypertension    Hypothyroid     Past Surgical History:  Procedure Laterality Date   BREAST REDUCTION SURGERY  08/2020   BREAST SURGERY  02/2021   revision   FOOT SURGERY     twice   TUBAL LIGATION Bilateral     Current Outpatient Medications  Medication Instructions   atorvastatin (LIPITOR) 20 MG tablet Take 1 tablet by mouth daily.   Cholecalciferol (VITAMIN D-3) 25 MCG (1000 UT) CAPS Oral   clindamycin (CLEOCIN) 300 mg, 3 times daily   doxycycline (VIBRA-TABS) 100 mg, 2 times daily   levothyroxine (SYNTHROID) 100 mcg, Oral, Daily before breakfast   omeprazole (PRILOSEC) 20 mg, Oral, Daily   pantoprazole (PROTONIX) 40 mg, Oral, Daily before breakfast   telmisartan-hydrochlorothiazide (MICARDIS HCT) 40-12.5 MG tablet 1 tablet, Oral, Daily   venlafaxine XR (EFFEXOR-XR) 150 MG 24 hr capsule Take 1 capsule by mouth daily with breakfast.   zaleplon (SONATA) 10 mg, At bedtime PRN       Objective:   Physical Exam BP 136/84   Pulse 94   Temp 98.3 F (36.8 C) (Oral)   Resp 16   Ht 5\' 7"  (1.702 m)   Wt 146 lb 4 oz (66.3 kg)   SpO2 97%   BMI 22.91 kg/m  General:   Well developed, NAD, BMI noted. HEENT:  Normocephalic . Face symmetric, atraumatic TMs: Normal Canal: No swelling,  no redness.  Few flakes?. Skin: Not pale. Not jaundice Neurologic:  alert & oriented X3.  Speech normal, gait appropriate for age and unassisted Psych--  Cognition and judgment appear intact.  Cooperative with normal attention span and concentration.  Behavior appropriate. No anxious or depressed appearing.      Assessment     Assessment HTN Stress/anxiety   LE edema : HCTZ prn Hyperlipidemia  Hypothyroidism GERD Insomnia-- sonata per gyn as of 06-2015 Seasonal allergies  Hot flashes, on Effexor (for hot flashes and anxiety) Chest pain, (-)  exercise stress test 2015 BTL COVID infection 09/2020   PLAN: Anxiety: Related to multiple factors including the health of her mother, the loss of a dear friend, etc.  Feels overwhelmed, no suicidal ideas. Listening therapy provided, recommend  self therapy, provided information about multiple local therapist but also may need to reach for a therapist online if  no local help is available. On Effexor long-term, changed to another medication Evlyn Kanner may benefit from Lexapro which is a little more calming; she will think about it and let me know. RTC as scheduled for November CPX

## 2021-12-09 NOTE — Patient Instructions (Signed)
  See you in November  

## 2021-12-10 NOTE — Assessment & Plan Note (Signed)
Anxiety: Related to multiple factors including the health of her mother, the loss of a dear friend, etc.  Feels overwhelmed, no suicidal ideas. Listening therapy provided, recommend  self therapy, provided information about multiple local therapist but also may need to reach for a therapist online if  no local help is available. On Effexor long-term, changed to another medication ? may benefit from Lexapro which is a little more calming; she will think about it and let me know. Ear exam: see physical exam, rec observation RTC as scheduled for November CPX

## 2022-01-05 ENCOUNTER — Encounter: Payer: Self-pay | Admitting: Internal Medicine

## 2022-01-05 ENCOUNTER — Ambulatory Visit (INDEPENDENT_AMBULATORY_CARE_PROVIDER_SITE_OTHER): Payer: Managed Care, Other (non HMO) | Admitting: Internal Medicine

## 2022-01-05 VITALS — BP 126/78 | HR 94 | Temp 97.8°F | Resp 16 | Ht 67.0 in | Wt 146.2 lb

## 2022-01-05 DIAGNOSIS — E559 Vitamin D deficiency, unspecified: Secondary | ICD-10-CM | POA: Diagnosis not present

## 2022-01-05 DIAGNOSIS — Z0001 Encounter for general adult medical examination with abnormal findings: Secondary | ICD-10-CM

## 2022-01-05 DIAGNOSIS — I10 Essential (primary) hypertension: Secondary | ICD-10-CM

## 2022-01-05 DIAGNOSIS — K219 Gastro-esophageal reflux disease without esophagitis: Secondary | ICD-10-CM

## 2022-01-05 DIAGNOSIS — Z0000 Encounter for general adult medical examination without abnormal findings: Secondary | ICD-10-CM

## 2022-01-05 DIAGNOSIS — E039 Hypothyroidism, unspecified: Secondary | ICD-10-CM

## 2022-01-05 DIAGNOSIS — E785 Hyperlipidemia, unspecified: Secondary | ICD-10-CM | POA: Diagnosis not present

## 2022-01-05 DIAGNOSIS — L308 Other specified dermatitis: Secondary | ICD-10-CM | POA: Diagnosis not present

## 2022-01-05 DIAGNOSIS — F418 Other specified anxiety disorders: Secondary | ICD-10-CM

## 2022-01-05 LAB — CBC WITH DIFFERENTIAL/PLATELET
Basophils Absolute: 0 10*3/uL (ref 0.0–0.1)
Basophils Relative: 0.7 % (ref 0.0–3.0)
Eosinophils Absolute: 0.1 10*3/uL (ref 0.0–0.7)
Eosinophils Relative: 0.9 % (ref 0.0–5.0)
HCT: 43.1 % (ref 36.0–46.0)
Hemoglobin: 14.6 g/dL (ref 12.0–15.0)
Lymphocytes Relative: 32.8 % (ref 12.0–46.0)
Lymphs Abs: 1.8 10*3/uL (ref 0.7–4.0)
MCHC: 33.9 g/dL (ref 30.0–36.0)
MCV: 93.5 fl (ref 78.0–100.0)
Monocytes Absolute: 0.7 10*3/uL (ref 0.1–1.0)
Monocytes Relative: 12.1 % — ABNORMAL HIGH (ref 3.0–12.0)
Neutro Abs: 2.9 10*3/uL (ref 1.4–7.7)
Neutrophils Relative %: 53.5 % (ref 43.0–77.0)
Platelets: 200 10*3/uL (ref 150.0–400.0)
RBC: 4.6 Mil/uL (ref 3.87–5.11)
RDW: 12.4 % (ref 11.5–15.5)
WBC: 5.4 10*3/uL (ref 4.0–10.5)

## 2022-01-05 LAB — COMPREHENSIVE METABOLIC PANEL
ALT: 15 U/L (ref 0–35)
AST: 19 U/L (ref 0–37)
Albumin: 4.7 g/dL (ref 3.5–5.2)
Alkaline Phosphatase: 97 U/L (ref 39–117)
BUN: 18 mg/dL (ref 6–23)
CO2: 32 mEq/L (ref 19–32)
Calcium: 10.5 mg/dL (ref 8.4–10.5)
Chloride: 99 mEq/L (ref 96–112)
Creatinine, Ser: 0.76 mg/dL (ref 0.40–1.20)
GFR: 83.01 mL/min (ref >60.00)
Glucose, Bld: 100 mg/dL — ABNORMAL HIGH (ref 70–99)
Potassium: 4.1 mEq/L (ref 3.5–5.1)
Sodium: 140 mEq/L (ref 135–145)
Total Bilirubin: 0.5 mg/dL (ref 0.2–1.2)
Total Protein: 7.3 g/dL (ref 6.0–8.3)

## 2022-01-05 LAB — LIPID PANEL
Cholesterol: 222 mg/dL — ABNORMAL HIGH (ref 0–200)
HDL: 86.1 mg/dL (ref >39.00)
LDL Cholesterol: 100 mg/dL — ABNORMAL HIGH (ref 0–99)
NonHDL: 136.15
Total CHOL/HDL Ratio: 3
Triglycerides: 181 mg/dL — ABNORMAL HIGH (ref 0.0–149.0)
VLDL: 36.2 mg/dL (ref 0.0–40.0)

## 2022-01-05 LAB — VITAMIN D 25 HYDROXY (VIT D DEFICIENCY, FRACTURES): VITD: 41.48 ng/mL (ref 30.00–100.00)

## 2022-01-05 MED ORDER — PREDNISOLONE ACETATE 1 % OP SUSP: OPHTHALMIC | 0 refills | Status: DC

## 2022-01-05 NOTE — Patient Instructions (Addendum)
Please read information about healthcare power of attorney below   Vaccines I recommend:  Covid booster RSV vaccine   Use the "eyedrops" in your ears: 2 to 3 drops twice daily for 10 days. You can repeat it 1 time. If the eczema continue please let me know   Check the  blood pressure regularly BP GOAL is between 110/65 and  135/85. If it is consistently higher or lower, let me know   GO TO THE LAB : Get the blood work     GO TO THE FRONT DESK, PLEASE SCHEDULE YOUR APPOINTMENTS Come back for   a physical exam in 1 year.  Sooner if needed.    Do you have a "Living will" or "Health Care Power of attorney"? (Advance care planning documents)  If you already have a living will or healthcare power of attorney, is recommended you bring the copy to be scanned in your chart. The document will be available to all the doctors you see in the system.  If you don't have one, please consider create one.  A    More information at:  [email or web address]

## 2022-01-05 NOTE — Assessment & Plan Note (Signed)
Here for CPX HTN: Continue Micardis HCT.  Checking labs High cholesterol: On Lipitor 20 mg, good compliance, check labs  Hypothyroidism: On Synthroid, counseled about taking it away from PPIs and avoid Biotene, check TSH, further advised with results. Anxiety, insomnia: Well-controlled on Effexor and zolpidem Ear eczema: See physical exam, trial with prednisone, if no better after 2  treatments, ENT?. GERD: Switch pantoprazole to omeprazole due to cost RTC 1 year, sooner if needed.

## 2022-01-05 NOTE — Assessment & Plan Note (Signed)
-  Td 2016 - s/p  shingrex x 2 - RSV d/w pt  -  covid vax: rec booster pro>cons  - had a Flu shot - Female care : PAPs- DEXA per gyn   + FH breast cancer,  had a MMG and a breast MRI (ordered by plastic surgeion who did a breast reduction) - CCS:   Cscope 2010, scope done d/t bleed, no report available for review, pt told it was (-), no polyps . Cologuard neg 09/2018, 2023  -Diet and exercise: Counseled.. -Labs: CMP FLP CBC  vitamin D - POA d/w pt

## 2022-01-05 NOTE — Progress Notes (Signed)
Subjective:    Patient ID: Erin Long, female    DOB: 07/29/1955, 66 y.o.   MRN: [id number]  DOS:  01/05/2022 Type of visit - description: CPX  Here for CPX We talk about her chronic medical problems. Also continue with bilateral ear irritation described as some itchiness, dryness;  sees dry discharge on her pillow in the mornings.  No other ear symptoms.  Review of Systems  Other than above, a 14 point review of systems is negative       Past Medical History:  Diagnosis Date   Hot flashes    on effexor   Hypertension    Hypothyroid     Past Surgical History:  Procedure Laterality Date   BREAST REDUCTION SURGERY  08/2020   BREAST SURGERY  02/2021   revision   FOOT SURGERY     twice   TUBAL LIGATION Bilateral    Social History   Socioeconomic History   Marital status: Married    Spouse name: Not on file   Number of children: 1   Years of education: Not on file   Highest education level: Not on file  Occupational History   Occupation: stay at home mom  Tobacco Use   Smoking status: Never   Smokeless tobacco: Never  Substance and Sexual Activity   Alcohol use: Yes    Comment: socially   Drug use: No   Sexual activity: Not on file  Other Topics Concern   Not on file  Social History Narrative   One child one and 3 Gchildren   Parents Mr. And Mrs.Wynona Neat    Social Determinants of Health   Financial Resource Strain: Not on file  Food Insecurity: Not on file  Transportation Needs: Not on file  Physical Activity: Not on file  Stress: Not on file  Social Connections: Not on file  Intimate Partner Violence: Not on file     Current Outpatient Medications  Medication Instructions   atorvastatin (LIPITOR) 20 MG tablet Take 1 tablet by mouth daily.   Cholecalciferol (VITAMIN D-3) 25 MCG (1000 UT) CAPS Oral   levothyroxine (SYNTHROID) 100 mcg, Oral, Daily before breakfast   omeprazole (PRILOSEC) 20 mg, Oral, Daily   prednisoLONE acetate (PRED  FORTE) 1 % ophthalmic suspension 2 or 3 drops twice daily on each ear.  X10 days.   telmisartan-hydrochlorothiazide (MICARDIS HCT) 40-12.5 MG tablet 1 tablet, Oral, Daily   venlafaxine XR (EFFEXOR-XR) 150 MG 24 hr capsule Take 1 capsule by mouth daily with breakfast.   zaleplon (SONATA) 10 mg, At bedtime PRN       Objective:   Physical Exam BP 126/78   Pulse 94   Temp 97.8 F (36.6 C) (Oral)   Resp 16   Ht 5\' 7"  (1.702 m)   Wt 146 lb 4 oz (66.3 kg)   SpO2 97%   BMI 22.91 kg/m  General: Well developed, NAD, BMI noted Neck: No  thyromegaly  HEENT:  Normocephalic . Face symmetric, atraumatic Ear exam: At the entrance of B  ear canal there is dry clear discharge, some irritation.  Rest of the ear is completely normal Lungs:  CTA B Normal respiratory effort, no intercostal retractions, no accessory muscle use. Heart: RRR,  no murmur.  Abdomen:  Not distended, soft, non-tender. No rebound or rigidity.   Lower extremities: no pretibial edema bilaterally  Skin: Exposed areas without rash. Not pale. Not jaundice Neurologic:  alert & oriented X3.  Speech normal, gait appropriate for age and  unassisted Strength symmetric and appropriate for age.  Psych: Cognition and judgment appear intact.  Cooperative with normal attention span and concentration.  Behavior appropriate. No anxious or depressed appearing.     Assessment     Assessment HTN Stress/anxiety   LE edema : HCTZ prn Hyperlipidemia  Hypothyroidism GERD Insomnia-- sonata per gyn as of 06-2015 Seasonal allergies  Hot flashes, on Effexor (for hot flashes and anxiety) Chest pain, (-)  exercise stress test 2015 BTL  PLAN: Here for CPX HTN: Continue Micardis HCT.  Checking labs High cholesterol: On Lipitor 20 mg, good compliance, check labs  Hypothyroidism: On Synthroid, counseled about taking it away from PPIs and avoid Biotene, check TSH, further advised with results. Anxiety, insomnia: Well-controlled on  Effexor and zolpidem Ear eczema: See physical exam, trial with prednisone, if no better after 2  treatments, ENT?. GERD: Switch pantoprazole to omeprazole due to cost RTC 1 year, sooner if needed.  In addition to CPX, all chronic medical problems were assessed.

## 2022-01-06 ENCOUNTER — Other Ambulatory Visit (INDEPENDENT_AMBULATORY_CARE_PROVIDER_SITE_OTHER): Payer: Managed Care, Other (non HMO)

## 2022-01-06 DIAGNOSIS — E039 Hypothyroidism, unspecified: Secondary | ICD-10-CM

## 2022-01-06 LAB — TSH: TSH: 1.72 u[IU]/mL (ref 0.35–5.50)

## 2022-01-06 NOTE — Addendum Note (Signed)
Addended byConrad Dunseith D on: 01/06/2022 07:55 AM   Modules accepted: Orders

## 2022-01-31 ENCOUNTER — Other Ambulatory Visit: Payer: Self-pay | Admitting: Internal Medicine

## 2022-03-18 DIAGNOSIS — M720 Palmar fascial fibromatosis [Dupuytren]: Secondary | ICD-10-CM | POA: Diagnosis not present

## 2022-04-01 ENCOUNTER — Other Ambulatory Visit: Payer: Self-pay | Admitting: Internal Medicine

## 2022-04-13 DIAGNOSIS — L821 Other seborrheic keratosis: Secondary | ICD-10-CM | POA: Diagnosis not present

## 2022-05-29 ENCOUNTER — Encounter: Payer: Self-pay | Admitting: Internal Medicine

## 2022-06-26 ENCOUNTER — Encounter: Payer: Self-pay | Admitting: Family

## 2022-06-26 ENCOUNTER — Ambulatory Visit: Payer: BC Managed Care – PPO | Admitting: Family

## 2022-06-26 VITALS — BP 128/70 | HR 81 | Ht 67.0 in | Wt 147.6 lb

## 2022-06-26 DIAGNOSIS — R300 Dysuria: Secondary | ICD-10-CM | POA: Diagnosis not present

## 2022-06-26 LAB — POCT URINALYSIS DIPSTICK
Bilirubin, UA: NEGATIVE
Blood, UA: NEGATIVE
Glucose, UA: NEGATIVE
Ketones, UA: NEGATIVE
Nitrite, UA: NEGATIVE
Protein, UA: NEGATIVE
Spec Grav, UA: 1.02 (ref 1.010–1.025)
Urobilinogen, UA: 0.2 E.U./dL
pH, UA: 6 (ref 5.0–8.0)

## 2022-06-26 MED ORDER — SULFAMETHOXAZOLE-TRIMETHOPRIM 800-160 MG PO TABS: 1.0000 | ORAL_TABLET | Freq: Two times a day (BID) | ORAL | 0 refills | Status: DC

## 2022-06-26 NOTE — Progress Notes (Signed)
Erin Long is a 67 y.o. 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 90 tablet 2   Cholecalciferol (VITAMIN D-3) 25 MCG (1000 UT) CAPS Take by mouth.     levothyroxine (SYNTHROID) 100 MCG tablet Take 1 tablet by mouth daily before breakfast. 90 tablet 2   omeprazole (PRILOSEC) 20 MG capsule Take 20 mg by mouth daily.     prednisoLONE acetate (PRED FORTE) 1 % ophthalmic suspension 2 or 3 drops twice daily on each ear.  X10 days. 15 mL 0   telmisartan-hydrochlorothiazide (MICARDIS HCT) 40-12.5 MG tablet Take 1 tablet by mouth daily. 90 tablet 3   venlafaxine XR (EFFEXOR-XR) 150 MG 24 hr capsule Take 1 capsule by mouth daily with breakfast. 90 capsule 2   zaleplon (SONATA) 10 MG capsule Take 10 mg by mouth at bedtime as needed for sleep.     No current facility-administered medications for this visit.    Allergies: Patient has no known allergies.  Past Medical History:  Diagnosis Date   Hot flashes    on effexor   Hypertension    Hypothyroid     Past Surgical History:  Procedure Laterality Date   BREAST REDUCTION SURGERY  08/2020   BREAST SURGERY  02/2021   revision   FOOT SURGERY     twice   TUBAL LIGATION Bilateral     Family History  Problem Relation Age of Onset   Coronary artery disease Father        mid 48's   Heart disease Mother 60   Breast cancer Mother    Other Mother        blood disorder   Breast cancer Maternal Grandmother    Breast cancer  Other        aunt   Diabetes Neg Hx    Stroke Neg Hx    Colon cancer Neg Hx     Social History   Tobacco Use   Smoking status: Never   Smokeless tobacco: Never  Substance Use Topics   Alcohol use: Yes    Comment: socially    Subjective:   Presents with concerns for UTI;  Symptoms started yesterday afternoon- burning; have worsened quickly in past 24 hours- urgency, frequency, bladder pressure; no blood in urine; no fever;  Notes that when she gets UTI she does tend to require longer regimen for treatment;   Objective:  Vitals:   06/26/22 1400  BP: 128/70  Pulse: 81  SpO2: 97%  Weight: 147 lb 9.6 oz (67 kg)  Height: 5\' 7"  (1.702 m)    General: Well developed, well nourished, in no acute distress  Skin : Warm and dry.  Head: Normocephalic and atraumatic  Lungs: Respirations unlabored; clear to auscultation bilaterally without wheeze, rales, rhonchi  CVS exam: normal rate and regular rhythm.  Neurologic: Alert and oriented; speech intact; face symmetrical; moves all extremities well; CNII-XII intact without focal deficit   Assessment:  1. Dysuria     Plan:  Check U/A and urine culture;  start Bactrim DS bid x 7 days; follow up to be determined based on culture results; increase water intake as well.   No follow-ups on file.  Orders Placed This Encounter  Procedures   Urine Culture   POCT Urinalysis Dipstick    Requested Prescriptions    No prescriptions requested or ordered in this encounter

## 2022-06-27 LAB — URINE CULTURE
MICRO NUMBER:: 14941202
SPECIMEN QUALITY:: ADEQUATE

## 2022-09-21 ENCOUNTER — Other Ambulatory Visit (HOSPITAL_COMMUNITY): Payer: Self-pay | Admitting: Obstetrics and Gynecology

## 2022-09-21 DIAGNOSIS — Z1151 Encounter for screening for human papillomavirus (HPV): Secondary | ICD-10-CM | POA: Diagnosis not present

## 2022-09-21 DIAGNOSIS — Z8249 Family history of ischemic heart disease and other diseases of the circulatory system: Secondary | ICD-10-CM

## 2022-09-21 DIAGNOSIS — Z1231 Encounter for screening mammogram for malignant neoplasm of breast: Secondary | ICD-10-CM | POA: Diagnosis not present

## 2022-09-21 DIAGNOSIS — Z01419 Encounter for gynecological examination (general) (routine) without abnormal findings: Secondary | ICD-10-CM | POA: Diagnosis not present

## 2022-09-21 DIAGNOSIS — Z6822 Body mass index (BMI) 22.0-22.9, adult: Secondary | ICD-10-CM | POA: Diagnosis not present

## 2022-09-21 DIAGNOSIS — Z124 Encounter for screening for malignant neoplasm of cervix: Secondary | ICD-10-CM | POA: Diagnosis not present

## 2022-09-21 LAB — RESULTS CONSOLE HPV: CHL HPV: NEGATIVE

## 2022-09-21 LAB — HM PAP SMEAR: HPV, high-risk: NEGATIVE

## 2022-09-21 LAB — HM MAMMOGRAPHY

## 2022-09-22 ENCOUNTER — Other Ambulatory Visit (HOSPITAL_COMMUNITY): Payer: Self-pay | Admitting: Obstetrics and Gynecology

## 2022-09-22 DIAGNOSIS — Z800 Family history of malignant neoplasm of digestive organs: Secondary | ICD-10-CM

## 2022-10-02 ENCOUNTER — Ambulatory Visit (HOSPITAL_COMMUNITY): Admission: RE | Admit: 2022-10-02 | Payer: BC Managed Care – PPO | Source: Ambulatory Visit

## 2022-10-02 ENCOUNTER — Encounter (HOSPITAL_COMMUNITY): Payer: Self-pay

## 2022-10-02 ENCOUNTER — Ambulatory Visit (HOSPITAL_COMMUNITY): Payer: BC Managed Care – PPO

## 2022-10-08 ENCOUNTER — Encounter: Payer: Self-pay | Admitting: Internal Medicine

## 2022-10-12 ENCOUNTER — Other Ambulatory Visit: Payer: Self-pay | Admitting: Internal Medicine

## 2022-10-13 ENCOUNTER — Ambulatory Visit (HOSPITAL_COMMUNITY)
Admission: RE | Admit: 2022-10-13 | Discharge: 2022-10-13 | Disposition: A | Discharge status: Home / Self Care | Payer: BC Managed Care – PPO | Source: Ambulatory Visit | Attending: Obstetrics and Gynecology | Admitting: Obstetrics and Gynecology

## 2022-10-13 ENCOUNTER — Encounter: Payer: Self-pay | Admitting: Internal Medicine

## 2022-10-13 DIAGNOSIS — Z8249 Family history of ischemic heart disease and other diseases of the circulatory system: Secondary | ICD-10-CM | POA: Insufficient documentation

## 2022-12-22 ENCOUNTER — Other Ambulatory Visit: Payer: Self-pay | Admitting: Internal Medicine

## 2023-01-07 ENCOUNTER — Encounter: Payer: Self-pay | Admitting: Internal Medicine

## 2023-01-07 DIAGNOSIS — M810 Age-related osteoporosis without current pathological fracture: Secondary | ICD-10-CM | POA: Insufficient documentation

## 2023-01-08 ENCOUNTER — Encounter: Payer: Self-pay | Admitting: Internal Medicine

## 2023-01-08 ENCOUNTER — Encounter: Payer: Managed Care, Other (non HMO) | Admitting: Internal Medicine

## 2023-04-07 ENCOUNTER — Encounter: Payer: BC Managed Care – PPO | Admitting: Internal Medicine

## 2023-04-14 DIAGNOSIS — N952 Postmenopausal atrophic vaginitis: Secondary | ICD-10-CM | POA: Diagnosis not present

## 2023-05-25 ENCOUNTER — Encounter: Payer: Self-pay | Admitting: Internal Medicine

## 2023-05-25 ENCOUNTER — Ambulatory Visit (INDEPENDENT_AMBULATORY_CARE_PROVIDER_SITE_OTHER): Admitting: Internal Medicine

## 2023-05-25 VITALS — BP 122/80 | HR 93 | Temp 97.8°F | Resp 16 | Ht 67.0 in | Wt 144.0 lb

## 2023-05-25 DIAGNOSIS — E039 Hypothyroidism, unspecified: Secondary | ICD-10-CM | POA: Diagnosis not present

## 2023-05-25 DIAGNOSIS — E785 Hyperlipidemia, unspecified: Secondary | ICD-10-CM

## 2023-05-25 DIAGNOSIS — Z800 Family history of malignant neoplasm of digestive organs: Secondary | ICD-10-CM

## 2023-05-25 DIAGNOSIS — I10 Essential (primary) hypertension: Secondary | ICD-10-CM

## 2023-05-25 DIAGNOSIS — Z23 Encounter for immunization: Secondary | ICD-10-CM

## 2023-05-25 DIAGNOSIS — K219 Gastro-esophageal reflux disease without esophagitis: Secondary | ICD-10-CM

## 2023-05-25 DIAGNOSIS — Z0000 Encounter for general adult medical examination without abnormal findings: Secondary | ICD-10-CM

## 2023-05-25 DIAGNOSIS — Z0001 Encounter for general adult medical examination with abnormal findings: Secondary | ICD-10-CM

## 2023-05-25 DIAGNOSIS — F418 Other specified anxiety disorders: Secondary | ICD-10-CM

## 2023-05-25 DIAGNOSIS — E559 Vitamin D deficiency, unspecified: Secondary | ICD-10-CM | POA: Diagnosis not present

## 2023-05-25 MED ORDER — PREDNISOLONE ACETATE 1 % OP SUSP: OPHTHALMIC | 0 refills | Status: AC

## 2023-05-25 MED ORDER — ZALEPLON 10 MG PO CAPS: 10.0000 mg | ORAL_CAPSULE | Freq: Every evening | ORAL | 1 refills | Status: AC | PRN

## 2023-05-25 NOTE — Assessment & Plan Note (Signed)
 Here for CPX  Other issues addressed today: HTN: On Micardis HCT.  BP today looks good, check labs, recommend to check BPs at home. Anxiety, stress: On Effexor, she is somewhat concerned about long-term effects but risk of problems is low.  Nevertheless  I offered to decrease dose, declined for now.  Reassess periodically. Coronary calcium score: 10/13/2022, ordered by gynecology, score was 41, 73 percentile. No diabetes, LDL 100, on atorvastatin. Hyperlipidemia: On atorvastatin 20 mg daily, recheck FLP, in light of the slightly positive coronary calcium score will try to get the LDL close to 70.  Patient is somewhat concerned about taking atorvastatin long-term, advised that benefits outweighed risks of potential s/e. Hypothyroidism: On supplements, check TSH. GERD: Concerned about long-term PPIs, however without them she is symptomatic.  Continue low-dose PPIs, check B12. Insomnia: PDMP okay, refill Sonata.  Takes sporadically Eczema: Refill steroid drops to use at the ear sporadically. FH pancreatic cancer, sister.  Gynecology ordered an MRI. RTC 6 months

## 2023-05-25 NOTE — Progress Notes (Signed)
 Subjective:    Patient ID: Erin Long, female    DOB: 1955-03-10, 68 y.o.   MRN: [id number]  DOS:  05/25/2023 Type of visit - description: CPX  For CPX Chronic medical problems addressed. Feeling well. Occasional aches and pains from DJD  Review of Systems  Other than above, a 14 point review of systems is negative      Past Medical History:  Diagnosis Date   Hot flashes    on effexor   Hypertension    Hypothyroid     Past Surgical History:  Procedure Laterality Date   BREAST REDUCTION SURGERY  08/2020   BREAST SURGERY  02/2021   revision   FOOT SURGERY     twice   TUBAL LIGATION Bilateral    Social History   Socioeconomic History   Marital status: Married    Spouse name: Not on file   Number of children: 1   Years of education: Not on file   Highest education level: Not on file  Occupational History   Occupation: stay at home mom  Tobacco Use   Smoking status: Never   Smokeless tobacco: Never  Substance and Sexual Activity   Alcohol use: Yes    Comment: socially   Drug use: No   Sexual activity: Not on file  Other Topics Concern   Not on file  Social History Narrative   One child one and 3 Gchildren   Parents Mr. And Mrs.Wynona Neat    Social Drivers of Corporate investment banker Strain: Not on file  Food Insecurity: Not on file  Transportation Needs: Not on file  Physical Activity: Not on file  Stress: Not on file  Social Connections: Not on file  Intimate Partner Violence: Not on file     Current Outpatient Medications  Medication Instructions   atorvastatin (LIPITOR) 20 mg, Oral, Daily   Cholecalciferol (VITAMIN D-3) 25 MCG (1000 UT) CAPS Take by mouth.   levothyroxine (SYNTHROID) 100 mcg, Oral, Daily before breakfast   omeprazole (PRILOSEC) 20 mg, Daily   prednisoLONE acetate (PRED FORTE) 1 % ophthalmic suspension 2 or 3 drops twice daily on each ear.  X10 days.   telmisartan-hydrochlorothiazide (MICARDIS HCT) 40-12.5 MG tablet 1  tablet, Oral, Daily   venlafaxine XR (EFFEXOR-XR) 150 mg, Oral, Daily with breakfast   zaleplon (SONATA) 10 mg, Oral, At bedtime PRN       Objective:   Physical Exam BP 122/80   Pulse 93   Temp 97.8 F (36.6 C) (Oral)   Resp 16   Ht 5\' 7"  (1.702 m)   Wt 144 lb (65.3 kg)   SpO2 97%   BMI 22.55 kg/m  General: Well developed, NAD, BMI noted Neck: No  thyromegaly  HEENT:  Normocephalic . Face symmetric, atraumatic Lungs:  CTA B Normal respiratory effort, no intercostal retractions, no accessory muscle use. Heart: RRR,  no murmur.  Abdomen:  Not distended, soft, non-tender. No rebound or rigidity.   Lower extremities: no pretibial edema bilaterally  Skin: Exposed areas without rash. Not pale. Not jaundice Neurologic:  alert & oriented X3.  Speech normal, gait appropriate for age and unassisted Strength symmetric and appropriate for age.  Psych: Cognition and judgment appear intact.  Cooperative with normal attention span and concentration.  Behavior appropriate. No anxious or depressed appearing.     Assessment     Assessment HTN Stress/anxiety   LE edema : HCTZ prn Hyperlipidemia  Hypothyroidism GERD Insomnia-- sonata per gyn as of 06-2015  Seasonal allergies  Hot flashes, on Effexor (for hot flashes and anxiety) Chest pain, (-)  exercise stress test 2015 Coronary calcium score: 10/13/2022,  score was 41, 73 percentile. BTL  PLAN: Here for CPX -Td 2016 - s/p  shingrex x 2 - PNM 20 today   -Recommend a flu shot every fall, COVID-vaccine  - Female care : PAPs- DEXA per gyn   + FH breast cancer,  last MMG 09/2022 - [facility]:   Cscope 2010, scope done d/t bleed, no report available for review, pt told it was (-), no polyps . Cologuard neg 09/2018, 2023.  Next 2026 -Eats healthy, admits the need for more exercise.  Counseled. -Labs: B12 CMP FLP CBC vitamin D - POA d/w pt  Other issues addressed today: HTN: On Micardis HCT.  BP today looks good, check labs,  recommend to check BPs at home. Anxiety, stress: On Effexor, she is somewhat concerned about long-term effects but risk of problems is low.  Nevertheless  I offered to decrease dose, declined for now.  Reassess periodically. Coronary calcium score: 10/13/2022, ordered by gynecology, score was 41, 73 percentile. No diabetes, LDL 100, on atorvastatin. Hyperlipidemia: On atorvastatin 20 mg daily, recheck FLP, in light of the slightly positive coronary calcium score will try to get the LDL close to 70.  Patient is somewhat concerned about taking atorvastatin long-term, advised that benefits outweighed risks of potential s/e. Hypothyroidism: On supplements, check TSH. GERD: Concerned about long-term PPIs, however without them she is symptomatic.  Continue low-dose PPIs, check B12. Insomnia: PDMP okay, refill Sonata.  Takes sporadically Eczema: Refill steroid drops to use at the ear sporadically. FH pancreatic cancer, sister.  Gynecology ordered an MRI. RTC 6 months

## 2023-05-25 NOTE — Assessment & Plan Note (Signed)
 Here for CPX -Td 2016 - s/p  shingrex x 2 - PNM 20 today   -Recommend a flu shot every fall, COVID-vaccine  - Female care : PAPs- DEXA per gyn   + FH breast cancer,  last MMG 09/2022 - CCS:   Cscope 2010, scope done d/t bleed, no report available for review, pt told it was (-), no polyps . Cologuard neg 09/2018, 2023.  Next 2026 -Eats healthy, admits the need for more exercise.  Counseled. -Labs: B12 CMP FLP CBC vitamin D - POA d/w pt

## 2023-05-25 NOTE — Patient Instructions (Addendum)
 Vaccines I recommend: Covid booster Shot every fall   Check the  blood pressure regularly Blood pressure goal:  between 110/65 and  135/85. If it is consistently higher or lower, let me know     GO TO THE LAB : Get the blood work     Please go to the front desk: Arrange for a follow-up in 6 months      "Health Care Power of attorney" (Also know as a  "Living will" or  Advance care planning documents)  If you already have a living will or healthcare power of attorney, is recommended you bring the copy to be scanned in your chart.   The document will be available to all the doctors you see in the system.  If you are over 62 y/o and don't have the document, please read:  Advance care planning is a process that supports adults in  understanding and sharing their preferences regarding future medical care.  The patient's preferences are recorded in documents called Advance Directives and the can be modified at any time while the patient is in full mental capacity.     More information at: [email or web address]

## 2023-05-26 LAB — LIPID PANEL
Cholesterol: 203 mg/dL — ABNORMAL HIGH (ref 0–200)
HDL: 80.9 mg/dL (ref >39.00)
LDL Cholesterol: 97 mg/dL (ref 0–99)
NonHDL: 122.33
Total CHOL/HDL Ratio: 3
Triglycerides: 126 mg/dL (ref 0.0–149.0)
VLDL: 25.2 mg/dL (ref 0.0–40.0)

## 2023-05-26 LAB — COMPREHENSIVE METABOLIC PANEL WITH GFR
ALT: 17 U/L (ref 0–35)
AST: 18 U/L (ref 0–37)
Albumin: 4.8 g/dL (ref 3.5–5.2)
Alkaline Phosphatase: 97 U/L (ref 39–117)
BUN: 17 mg/dL (ref 6–23)
CO2: 30 meq/L (ref 19–32)
Calcium: 10 mg/dL (ref 8.4–10.5)
Chloride: 102 meq/L (ref 96–112)
Creatinine, Ser: 0.93 mg/dL (ref 0.40–1.20)
GFR: 64.52 mL/min (ref >60.00)
Glucose, Bld: 87 mg/dL (ref 70–99)
Potassium: 4.8 meq/L (ref 3.5–5.1)
Sodium: 143 meq/L (ref 135–145)
Total Bilirubin: 0.6 mg/dL (ref 0.2–1.2)
Total Protein: 7.3 g/dL (ref 6.0–8.3)

## 2023-05-26 LAB — TSH: TSH: 1.12 u[IU]/mL (ref 0.35–5.50)

## 2023-05-26 LAB — CBC WITH DIFFERENTIAL/PLATELET
Basophils Absolute: 0.1 10*3/uL (ref 0.0–0.1)
Basophils Relative: 0.7 % (ref 0.0–3.0)
Eosinophils Absolute: 0.1 10*3/uL (ref 0.0–0.7)
Eosinophils Relative: 1.2 % (ref 0.0–5.0)
HCT: 44.1 % (ref 36.0–46.0)
Hemoglobin: 14.8 g/dL (ref 12.0–15.0)
Lymphocytes Relative: 20.1 % (ref 12.0–46.0)
Lymphs Abs: 1.8 10*3/uL (ref 0.7–4.0)
MCHC: 33.5 g/dL (ref 30.0–36.0)
MCV: 95.4 fl (ref 78.0–100.0)
Monocytes Absolute: 0.8 10*3/uL (ref 0.1–1.0)
Monocytes Relative: 8.3 % (ref 3.0–12.0)
Neutro Abs: 6.4 10*3/uL (ref 1.4–7.7)
Neutrophils Relative %: 69.7 % (ref 43.0–77.0)
Platelets: 216 10*3/uL (ref 150.0–400.0)
RBC: 4.62 Mil/uL (ref 3.87–5.11)
RDW: 12.5 % (ref 11.5–15.5)
WBC: 9.2 10*3/uL (ref 4.0–10.5)

## 2023-05-26 LAB — B12 AND FOLATE PANEL
Folate: 7 ng/mL (ref >5.9)
Vitamin B-12: 187 pg/mL — ABNORMAL LOW (ref 211–911)

## 2023-05-26 LAB — VITAMIN D 25 HYDROXY (VIT D DEFICIENCY, FRACTURES): VITD: 47.02 ng/mL (ref 30.00–100.00)

## 2023-05-28 ENCOUNTER — Encounter: Payer: Self-pay | Admitting: Internal Medicine

## 2023-06-20 ENCOUNTER — Other Ambulatory Visit: Payer: Self-pay | Admitting: Internal Medicine

## 2023-10-27 DIAGNOSIS — Z01419 Encounter for gynecological examination (general) (routine) without abnormal findings: Secondary | ICD-10-CM | POA: Diagnosis not present

## 2023-10-27 DIAGNOSIS — Z6823 Body mass index (BMI) 23.0-23.9, adult: Secondary | ICD-10-CM | POA: Diagnosis not present

## 2023-10-27 DIAGNOSIS — Z124 Encounter for screening for malignant neoplasm of cervix: Secondary | ICD-10-CM | POA: Diagnosis not present

## 2023-10-27 DIAGNOSIS — Z1231 Encounter for screening mammogram for malignant neoplasm of breast: Secondary | ICD-10-CM | POA: Diagnosis not present

## 2023-10-27 LAB — HM MAMMOGRAPHY

## 2023-10-28 ENCOUNTER — Other Ambulatory Visit (HOSPITAL_COMMUNITY): Payer: Self-pay | Admitting: Obstetrics and Gynecology

## 2023-10-28 DIAGNOSIS — Z800 Family history of malignant neoplasm of digestive organs: Secondary | ICD-10-CM

## 2023-11-08 ENCOUNTER — Encounter (HOSPITAL_COMMUNITY): Payer: Self-pay

## 2023-11-08 ENCOUNTER — Ambulatory Visit (HOSPITAL_COMMUNITY): Admission: RE | Admit: 2023-11-08 | Source: Ambulatory Visit

## 2023-11-16 ENCOUNTER — Ambulatory Visit (HOSPITAL_COMMUNITY)
Admission: RE | Admit: 2023-11-16 | Discharge: 2023-11-16 | Disposition: A | Discharge status: Home / Self Care | Source: Ambulatory Visit | Attending: Obstetrics and Gynecology | Admitting: Obstetrics and Gynecology

## 2023-11-16 ENCOUNTER — Other Ambulatory Visit (HOSPITAL_COMMUNITY): Payer: Self-pay | Admitting: Obstetrics and Gynecology

## 2023-11-16 ENCOUNTER — Encounter (HOSPITAL_COMMUNITY): Payer: Self-pay

## 2023-11-16 DIAGNOSIS — Z1289 Encounter for screening for malignant neoplasm of other sites: Secondary | ICD-10-CM | POA: Diagnosis not present

## 2023-11-16 DIAGNOSIS — Z800 Family history of malignant neoplasm of digestive organs: Secondary | ICD-10-CM

## 2023-11-16 DIAGNOSIS — K8689 Other specified diseases of pancreas: Secondary | ICD-10-CM | POA: Diagnosis not present

## 2023-11-16 MED ORDER — GADOBUTROL 1 MMOL/ML IV SOLN
6.0000 mL | Freq: Once | INTRAVENOUS | Status: AC | PRN
Start: 2023-11-16 — End: 2023-11-16
  Administered 2023-11-16: 6 mL via INTRAVENOUS

## 2023-11-23 ENCOUNTER — Encounter: Payer: Self-pay | Admitting: Internal Medicine

## 2023-11-23 DIAGNOSIS — F419 Anxiety disorder, unspecified: Secondary | ICD-10-CM | POA: Insufficient documentation

## 2023-11-24 ENCOUNTER — Ambulatory Visit: Admitting: Internal Medicine

## 2023-11-24 ENCOUNTER — Encounter: Payer: Self-pay | Admitting: Internal Medicine

## 2023-11-25 ENCOUNTER — Encounter: Payer: Self-pay | Admitting: Internal Medicine

## 2023-12-10 DIAGNOSIS — M816 Localized osteoporosis [Lequesne]: Secondary | ICD-10-CM | POA: Diagnosis not present

## 2023-12-17 ENCOUNTER — Other Ambulatory Visit: Payer: Self-pay | Admitting: Internal Medicine

## 2023-12-22 ENCOUNTER — Other Ambulatory Visit: Payer: Self-pay | Admitting: Internal Medicine

## 2023-12-23 ENCOUNTER — Other Ambulatory Visit: Payer: Self-pay

## 2023-12-23 MED ORDER — TELMISARTAN-HCTZ 40-12.5 MG PO TABS: 1.0000 | ORAL_TABLET | Freq: Every day | ORAL | 0 refills | Status: AC

## 2024-01-04 ENCOUNTER — Encounter (HOSPITAL_BASED_OUTPATIENT_CLINIC_OR_DEPARTMENT_OTHER): Payer: Self-pay

## 2024-01-05 ENCOUNTER — Ambulatory Visit (HOSPITAL_BASED_OUTPATIENT_CLINIC_OR_DEPARTMENT_OTHER): Admitting: Nurse Practitioner

## 2024-01-05 ENCOUNTER — Encounter (HOSPITAL_BASED_OUTPATIENT_CLINIC_OR_DEPARTMENT_OTHER): Payer: Self-pay | Admitting: Nurse Practitioner

## 2024-01-05 VITALS — BP 132/82 | HR 72 | Ht 67.0 in | Wt 146.2 lb

## 2024-01-05 DIAGNOSIS — Z7189 Other specified counseling: Secondary | ICD-10-CM

## 2024-01-05 DIAGNOSIS — E785 Hyperlipidemia, unspecified: Secondary | ICD-10-CM

## 2024-01-05 DIAGNOSIS — I10 Essential (primary) hypertension: Secondary | ICD-10-CM | POA: Diagnosis not present

## 2024-01-05 DIAGNOSIS — R053 Chronic cough: Secondary | ICD-10-CM | POA: Diagnosis not present

## 2024-01-05 DIAGNOSIS — I2510 Atherosclerotic heart disease of native coronary artery without angina pectoris: Secondary | ICD-10-CM
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
## 2024-01-05 NOTE — Patient Instructions (Signed)
 Testing/Procedures:    Your cardiac CT will be scheduled at one of the below locations: The patient will review test and decide if patient wants to take it.   [facility] [address] [place], [place] [address] [phone] (Severe contrast allergies only)  OR   [facility] [address] [place], [place] [address] [phone]  OR   [facility] [address] [place], [place] [address] [phone]  OR   [facility] [address]  [place], [place] [address]  OR   [facility] [address] [place], [place] [phone]  If scheduled at [facility], please arrive at the [facility] Entrance (Entrance C2) of [facility] 30 minutes prior to test start time. You can use the FREE valet parking offered at entrance C (encouraged to control the heart rate for the test)  Proceed to the [facility] Radiology Department (first floor) to check-in and test prep.  All radiology patients and guests should use entrance C2 at [facility], accessed from [facility], even though the hospital's physical address listed is [address].  If scheduled at the Heart and Vascular Tower at [address], please enter the parking lot using the [address] entrance and use the FREE valet service at the patient drop-off area. Enter the building and check-in with registration on the main floor.  If scheduled at [facility], please arrive to the Heart and Vascular Center 15 mins early for check-in and test prep.  There is spacious parking and easy access to the radiology department from the [facility] Heart and Vascular entrance. Please enter here and check-in with the desk attendant.   If scheduled at [facility], please arrive 30 minutes early for check-in and test prep.  Please follow these instructions carefully  (unless otherwise directed):  An IV will be required for this test and Nitroglycerin will be given.  Hold all erectile dysfunction medications at least 3 days (72 hrs) prior to test. (Ie viagra, cialis, sildenafil, tadalafil, etc)   On the Night Before the Test: Be sure to Drink plenty of water. Do not consume any caffeinated/decaffeinated beverages or chocolate 12 hours prior to your test. Do not take any antihistamines 12 hours prior to your test.   On the Day of the Test: Drink plenty of water until 1 hour prior to the test. Do not eat any food 1 hour prior to test. You may take your regular medications prior to the test.  Take metoprolol  (Lopressor ) two hours prior to test. If you take Furosemide/Hydrochlorothiazide /Spironolactone/Chlorthalidone, please HOLD on the morning of the test. Patients who wear a continuous glucose monitor MUST remove the device prior to scanning. FEMALES- please wear underwire-free bra if available, avoid dresses & tight clothing     After the Test: Drink plenty of water. After receiving IV contrast, you may experience a mild flushed feeling. This is normal. On occasion, you may experience a mild rash up to 24 hours after the test. This is not dangerous. If this occurs, you can take Benadryl 25 mg, Zyrtec, Claritin, or Allegra and increase your fluid intake. (Patients taking Tikosyn should avoid Benadryl, and may take Zyrtec, Claritin, or Allegra) If you experience trouble breathing, this can be serious. If it is severe call 911 IMMEDIATELY. If it is mild, please call our office.  We will call to schedule your test 2-4 weeks  out understanding that some insurance companies will need an authorization prior to the service being performed.   For more information and frequently asked questions, please visit our website : [email or web address]  For non-scheduling related questions, please contact the cardiac imaging nurse navigator should you have  any questions/concerns: Cardiac Imaging Nurse Navigators Direct Office Dial: [phone]   For scheduling needs, including cancellations and rescheduling, please call Brittany, [phone].    Follow-Up: At [facility], you and your health needs are our priority.  As part of our continuing mission to provide you with exceptional heart care, our providers are all part of one team.  This team includes your primary Cardiologist (physician) and Advanced Practice Providers or APPs (Physician Assistants and Nurse Practitioners) who all work together to provide you with the care you need, when you need it.  Your next appointment:    Will be determined if patient decides to get testing.   Provider:   Rosaline Bane, NP    We recommend signing up for the patient portal called MyChart.  Sign up information is provided on this After Visit Summary.  MyChart is used to connect with patients for Virtual Visits (Telemedicine).  Patients are able to view lab/test results, encounter notes, upcoming appointments, etc.  Non-urgent messages can be sent to your provider as well.   To learn more about what you can do with MyChart, go to [email or web address].   Other Instructions

## 2024-01-05 NOTE — Progress Notes (Signed)
 Cardiology Office Note:  .   Date:  01/05/2024 ID:  Erin Long, DOB Mar 28, 1955, MRN [id number] PCP: Amon Aloysius BRAVO, MD [facility] Providers Cardiologist:  None   Patient Profile: .      PMH Family history of heart disease Hypothyroidism Hypertension Hyperlipidemia  Aortic atherosclerosis Coronary artery disease CT Calcium  score 10/13/22 CAC score of 41 (73rd percentile) LM 0, LAD 38.8, LCx 0, RCA 2.1       History of Present Illness: .    Discussed the use of AI scribe software for clinical note transcription with the patient, who gave verbal consent to proceed.  History of Present Illness Erin Long is a very pleasant 68 year old female who presents for evaluation of coronary artery calcification. She was referred by her OB for a CT scan due to her advanced age and family history of heart disease. CT calcium  score 09/2022, shows a calcium  score of 41, placing her in the 73rd percentile for women her age. Her family history includes her father having heart attacks in his late forties or early fifties. She manages her cholesterol with atorvastatin , with an LDL of 97 as of April. Her lifestyle includes limited exercise, with some walking, and a moderately healthy diet with occasional indulgences. Her husband has had a quadruple bypass and is Repatha for management of hyperlipidemia. She avoids aspirin due to a tendency to bruise heavily.  She denies chest pain, shortness of breath, orthopnea, PND, edema, palpitations, presyncope, syncope. She has a persistent cough, possibly related to allergies, medication, or reflux.   Family history: Her family history includes Breast cancer in her maternal grandmother, mother, and another family member; Coronary artery disease in her father; Heart disease (age of onset: 78) in her mother; Other in her mother; Pancreatic cancer in her sister.  Mother is living at age 20, now in long term care Father died early 1s, stroke, emphysema, 2  pacemakers, MI age late 40s/early 23s Both were heavy smokers Older sister died from pancreatic cancer and younger sister is breast cancer survivor  Discussed the use of AI scribe software for clinical note transcription with the patient, who gave verbal consent to proceed.  ASCVD Risk Score: The 10-year ASCVD risk score (Arnett DK, et al., 2019) is: 7.7%   Values used to calculate the score:     Age: 60 years     Clincally relevant sex: Female     Is Non-Hispanic African American: No     Diabetic: No     Tobacco smoker: No     Systolic Blood Pressure: 132 mmHg     Is BP treated: Yes     HDL Cholesterol: 80.9 mg/dL     Total Cholesterol: 203 mg/dL   Diet: Southern cooking Eats out often  Activity: Not sedentary but no formal exercise Occasional walking  No results found for: LIPOA    ROS: See HPI       Studies Reviewed: SABRA   EKG Interpretation Date/Time:  Wednesday January 05 2024 14:57:44 EST Ventricular Rate:  89 PR Interval:  140 QRS Duration:  72 QT Interval:  364 QTC Calculation: 442 R Axis:   14  Text Interpretation: Normal sinus rhythm Possible Left atrial enlargement Septal infarct (cited on or before 25-Aug-2018) When compared with ECG of 25-Aug-2018 17:17, No significant change was found Confirmed by Percy Browning [phone]) on 01/05/2024 3:19:02 PM       Risk Assessment/Calculations:  Physical Exam:   VS: BP 132/82   Pulse 72   Ht 5' 7 (1.702 m)   Wt 146 lb 3.2 oz (66.3 kg)   SpO2 92%   BMI 22.90 kg/m   Wt Readings from Last 3 Encounters:  01/05/24 146 lb 3.2 oz (66.3 kg)  05/25/23 144 lb (65.3 kg)  06/26/22 147 lb 9.6 oz (67 kg)     GEN: Well nourished, well developed in no acute distress NECK: No JVD; No carotid bruits CARDIAC: RRR, no murmurs, rubs, gallops RESPIRATORY:  Clear to auscultation without rales, wheezing or rhonchi  ABDOMEN: Soft, non-tender, non-distended EXTREMITIES:  No edema; No deformity      ASSESSMENT AND PLAN: .    Assessment & Plan Coronary artery calcification Cardiac risk CT calcium  score of 41 places her in the 73rd percentile for age/sex matched controls.  We reviewed these findings in light of additional risk factors that may contribute to elevated risk for ASCVD.  Longtime history of hyperlipidemia.  She has been compliant with atorvastatin  for some time.  We reviewed consideration of further evaluation for ischemia.  She is active but does not exercise on a consistent basis. EKG NSR at 89 bpm, septal infarct age undetermined, noted on prior EKG. advise she could consider starting 81 mg aspirin daily, though optional due to bruising tendency.  She would like to discuss these findings with PCP at appointment next week and will contact us  for further testing. - Reviewed potential testing modalities to evaluate for ischemia, ideally coronary CTA - Consider starting aspirin 81 mg daily -  Encourage healthy lifestyle with heart healthy diet avoiding saturated fat, processed foods, sugar, and other simple carbohydrates - Aim for at least 150 minutes of moderate intensity exercise each week as well as being physically active every day -  Continue atorvastatin , Micardis  HCT  Hyperlipidemia LDL goal < 70 Lipid panel completed 05/25/2023 with total cholesterol 203, HDL 80, LDL 97, and triglycerides 873.  She has been on atorvastatin  20 mg for several years and reports compliance.  Advised LDL goal 70 mg/dL or lower due to coronary calcification. Statin therapy reduces cardiovascular risk.  She plans to discuss further with PCP at appointment next week. - Discuss with her primary care physician about adjusting atorvastatin  or changing medication to achieve the LDL goal - Consider testing for lipoprotein A for cardiovascular risk stratification - Continue atorvastatin   Essential hypertension   Blood pressure is generally well controlled, which is crucial for cardiovascular risk  reduction. Continue current blood pressure management and monitor regularly. - Continue telmisartan -chlorothiazide  Chronic cough She reports chronic cough which she attributed to allergies, but a friend suggested it is frequent. Reviewed that it may be related to telmisartan , allergies or reflux.  - Consider discontinuing telmisartan  to assess cough improvement - Evaluate for allergies or reflux that may be contributing       Dispo: TBD  Signed, Rosaline Bane, NP-C

## 2024-01-10 DIAGNOSIS — M818 Other osteoporosis without current pathological fracture: Secondary | ICD-10-CM | POA: Diagnosis not present

## 2024-03-16 ENCOUNTER — Other Ambulatory Visit: Payer: Self-pay | Admitting: Internal Medicine

## 2024-04-26 ENCOUNTER — Ambulatory Visit: Admitting: Internal Medicine
